# Patient Record
Sex: Female | Born: 1945 | Race: Black or African American | Hispanic: No | Smoking: Current some day smoker
Health system: Southern US, Community
[De-identification: ages and names within clinical notes are randomized; demographics above are authoritative.]

## PROBLEM LIST (undated history)

## (undated) DIAGNOSIS — I10 Essential (primary) hypertension: Secondary | ICD-10-CM

## (undated) DIAGNOSIS — K219 Gastro-esophageal reflux disease without esophagitis: Secondary | ICD-10-CM

## (undated) DIAGNOSIS — J449 Chronic obstructive pulmonary disease, unspecified: Secondary | ICD-10-CM

## (undated) DIAGNOSIS — M1990 Unspecified osteoarthritis, unspecified site: Secondary | ICD-10-CM

## (undated) DIAGNOSIS — M810 Age-related osteoporosis without current pathological fracture: Secondary | ICD-10-CM

## (undated) DIAGNOSIS — G4730 Sleep apnea, unspecified: Secondary | ICD-10-CM

## (undated) DIAGNOSIS — F329 Major depressive disorder, single episode, unspecified: Secondary | ICD-10-CM

## (undated) DIAGNOSIS — M51369 Other intervertebral disc degeneration, lumbar region without mention of lumbar back pain or lower extremity pain: Secondary | ICD-10-CM

## (undated) DIAGNOSIS — F1910 Other psychoactive substance abuse, uncomplicated: Secondary | ICD-10-CM

## (undated) DIAGNOSIS — F32A Depression, unspecified: Secondary | ICD-10-CM

## (undated) DIAGNOSIS — M159 Polyosteoarthritis, unspecified: Secondary | ICD-10-CM

## (undated) DIAGNOSIS — F419 Anxiety disorder, unspecified: Secondary | ICD-10-CM

## (undated) DIAGNOSIS — M5136 Other intervertebral disc degeneration, lumbar region: Secondary | ICD-10-CM

## (undated) HISTORY — DX: Anxiety disorder, unspecified: F41.9

## (undated) HISTORY — DX: Age-related osteoporosis without current pathological fracture: M81.0

## (undated) HISTORY — DX: Unspecified osteoarthritis, unspecified site: M19.90

## (undated) HISTORY — DX: Other intervertebral disc degeneration, lumbar region: M51.36

## (undated) HISTORY — DX: Gastro-esophageal reflux disease without esophagitis: K21.9

## (undated) HISTORY — DX: Essential (primary) hypertension: I10

## (undated) HISTORY — PX: CERVICAL SPINE SURGERY: SHX589

## (undated) HISTORY — PX: APPENDECTOMY: SHX54

## (undated) HISTORY — PX: CARPAL TUNNEL RELEASE: SHX101

## (undated) HISTORY — DX: Other psychoactive substance abuse, uncomplicated: F19.10

## (undated) HISTORY — DX: Polyosteoarthritis, unspecified: M15.9

## (undated) HISTORY — PX: ROTATOR CUFF REPAIR: SHX139

## (undated) HISTORY — DX: Sleep apnea, unspecified: G47.30

## (undated) HISTORY — DX: Major depressive disorder, single episode, unspecified: F32.9

## (undated) HISTORY — DX: Depression, unspecified: F32.A

## (undated) HISTORY — DX: Chronic obstructive pulmonary disease, unspecified: J44.9

## (undated) HISTORY — DX: Other intervertebral disc degeneration, lumbar region without mention of lumbar back pain or lower extremity pain: M51.369

---

## 2017-08-28 DIAGNOSIS — G44209 Tension-type headache, unspecified, not intractable: Secondary | ICD-10-CM | POA: Diagnosis not present

## 2017-08-28 DIAGNOSIS — Z6825 Body mass index (BMI) 25.0-25.9, adult: Secondary | ICD-10-CM | POA: Diagnosis not present

## 2017-08-28 DIAGNOSIS — J069 Acute upper respiratory infection, unspecified: Secondary | ICD-10-CM | POA: Diagnosis not present

## 2018-10-22 ENCOUNTER — Encounter: Payer: Self-pay | Admitting: Family Medicine

## 2018-10-22 ENCOUNTER — Ambulatory Visit (INDEPENDENT_AMBULATORY_CARE_PROVIDER_SITE_OTHER): Payer: Medicare Other | Admitting: Family Medicine

## 2018-10-22 VITALS — BP 144/82 | HR 77 | Temp 98.2°F | Resp 16 | Ht 67.5 in | Wt 163.2 lb

## 2018-10-22 DIAGNOSIS — Z8701 Personal history of pneumonia (recurrent): Secondary | ICD-10-CM

## 2018-10-22 DIAGNOSIS — Z9889 Other specified postprocedural states: Secondary | ICD-10-CM

## 2018-10-22 DIAGNOSIS — K219 Gastro-esophageal reflux disease without esophagitis: Secondary | ICD-10-CM

## 2018-10-22 DIAGNOSIS — Z23 Encounter for immunization: Secondary | ICD-10-CM | POA: Diagnosis not present

## 2018-10-22 DIAGNOSIS — G4733 Obstructive sleep apnea (adult) (pediatric): Secondary | ICD-10-CM

## 2018-10-22 DIAGNOSIS — I10 Essential (primary) hypertension: Secondary | ICD-10-CM

## 2018-10-22 DIAGNOSIS — J411 Mucopurulent chronic bronchitis: Secondary | ICD-10-CM | POA: Diagnosis not present

## 2018-10-22 DIAGNOSIS — G894 Chronic pain syndrome: Secondary | ICD-10-CM

## 2018-10-22 DIAGNOSIS — M51369 Other intervertebral disc degeneration, lumbar region without mention of lumbar back pain or lower extremity pain: Secondary | ICD-10-CM

## 2018-10-22 DIAGNOSIS — G4730 Sleep apnea, unspecified: Secondary | ICD-10-CM | POA: Insufficient documentation

## 2018-10-22 DIAGNOSIS — F5104 Psychophysiologic insomnia: Secondary | ICD-10-CM

## 2018-10-22 DIAGNOSIS — J449 Chronic obstructive pulmonary disease, unspecified: Secondary | ICD-10-CM | POA: Insufficient documentation

## 2018-10-22 DIAGNOSIS — M5136 Other intervertebral disc degeneration, lumbar region: Secondary | ICD-10-CM | POA: Insufficient documentation

## 2018-10-22 DIAGNOSIS — F339 Major depressive disorder, recurrent, unspecified: Secondary | ICD-10-CM

## 2018-10-22 DIAGNOSIS — M810 Age-related osteoporosis without current pathological fracture: Secondary | ICD-10-CM

## 2018-10-22 MED ORDER — PAROXETINE HCL 40 MG PO TABS: 40.0000 mg | ORAL_TABLET | ORAL | 1 refills | Status: DC

## 2018-10-22 MED ORDER — AMLODIPINE BESYLATE 5 MG PO TABS: 5.0000 mg | ORAL_TABLET | Freq: Every day | ORAL | 1 refills | Status: DC

## 2018-10-22 MED ORDER — LOSARTAN POTASSIUM 100 MG PO TABS: 100.0000 mg | ORAL_TABLET | Freq: Every day | ORAL | 1 refills | Status: DC

## 2018-10-22 MED ORDER — PANTOPRAZOLE SODIUM 40 MG PO TBEC: 40.0000 mg | DELAYED_RELEASE_TABLET | Freq: Every day | ORAL | 1 refills | Status: DC

## 2018-10-22 NOTE — Progress Notes (Signed)
Name: Natalie Lloyd   MRN: [id number]    DOB: 01/06/1946   Date:10/22/2018       Progress Note  Subjective  Chief Complaint  Chief Complaint  Patient presents with  . Establish Care  . Labs Only  . Immunizations    HPI   COPD : diagnosed years ago, not on maintenance medication and does not want it at this time. She states very sick this past Spring, she had pneumonia and was hospitalized . She states SOB and cough got worse after the pneumonia. No fever or chills at this time  HTN: she is taking medications, but states that ran out of losartan and has noticed a headache over the past couple of days. No chest pain or palpitation  Osteoporosis: she has not been taking medication, we will get records from [place]  Chronic pain: she had multiple surgeries, she has DDD spine and also right shoulder surgery and history of dislocation, she was seeing pain clinic at [place], she has been out of medication for the past 4 days , explained that we will place referral to local pain management clinic Pain on her shoulder is 7/10  GERD: she is on pantoprazole and she states no heartburn or regurgitation with medication, but unable to come off medication. She is aware of long term risk   Major Depression: she has a long history of depression, states she used to go weeks without living her house because of sadness. She started medication many years ago - about 15 years. Medication works well for her. No side effects.   Insomnia: she takes Trazodone prn only and is doing well at this time   Past Surgical History:  Procedure Laterality Date  . APPENDECTOMY    . CARPAL TUNNEL RELEASE    . CERVICAL SPINE SURGERY    . ROTATOR CUFF REPAIR      History reviewed. No pertinent family history.  Social History   Socioeconomic History  . Marital status: Widowed    Spouse name: Benne  . Number of children: 5  . Years of education: Not on file  . Highest education level: Bachelor's degree (e.g.,  BA, AB, BS)  Occupational History  . Occupation: Disabled  Social Needs  . Financial resource strain: Somewhat hard  . Food insecurity:    Worry: Never true    Inability: Never true  . Transportation needs:    Medical: No    Non-medical: No  Tobacco Use  . Smoking status: Current Some Day Smoker    Years: 50.00    Types: Cigarettes  . Smokeless tobacco: Never Used  . Tobacco comment: 3-4 cigarettes per day  Substance and Sexual Activity  . Alcohol use: Yes    Comment: occ wine cooler  . Drug use: Not Currently    Types: IV    Comment: 37.5 yrs ago  . Sexual activity: Not Currently    Partners: Male    Birth control/protection: None  Lifestyle  . Physical activity:    Days per week: 7 days    Minutes per session: 20 min  . Stress: Not at all  Relationships  . Social connections:    Talks on phone: More than three times a week    Gets together: Never    Attends religious service: 1 to 4 times per year    Active member of club or organization: No    Attends meetings of clubs or organizations: Never    Relationship status: Widowed  . Intimate partner  violence:    Fear of current or ex partner: No    Emotionally abused: No    Physically abused: No    Forced sexual activity: No  Other Topics Concern  . Not on file  Social History Narrative   Patient has 5 grown kids, 16 or 17 grandkids & about 3 great-grandkids.      Widowed for 4 yrs (10/6) now after 19yrs      Patient has moved here from [place].     Current Outpatient Medications:  .  albuterol (PROVENTIL HFA;VENTOLIN HFA) 108 (90 Base) MCG/ACT inhaler, Inhale 2 puffs into the lungs every 6 (six) hours as needed for wheezing or shortness of breath., Disp: , Rfl:  .  amLODipine (NORVASC) 5 MG tablet, Take 5 mg by mouth daily., Disp: , Rfl:  .  gabapentin (NEURONTIN) 100 MG capsule, Take 100 mg by mouth 3 (three) times daily., Disp: , Rfl:  .  HYDROcodone-acetaminophen (NORCO) 10-325 MG tablet, Take 1  tablet by mouth every 6 (six) hours as needed., Disp: , Rfl:  .  losartan (COZAAR) 100 MG tablet, Take 100 mg by mouth daily., Disp: , Rfl:  .  pantoprazole (PROTONIX) 40 MG tablet, Take 40 mg by mouth daily., Disp: , Rfl:  .  PARoxetine (PAXIL) 40 MG tablet, Take 40 mg by mouth every morning., Disp: , Rfl:  .  traZODone (DESYREL) 50 MG tablet, Take 50 mg by mouth at bedtime., Disp: , Rfl:   No Known Allergies  I personally reviewed active problem list, medication list, allergies, family history, social history with the patient/caregiver today.   ROS  Constitutional: Negative for fever or weight change.  Respiratory: positive  for cough and shortness of breath.   Cardiovascular: Negative for chest pain or palpitations.  Gastrointestinal: Negative for abdominal pain, no bowel changes.  Musculoskeletal: negative for gait problem ( difficulty going up stairs because cannot use arm to assist ) but no joint swelling.  Skin: Negative for rash.  Neurological: Negative for dizziness, positive  headache.  No other specific complaints in a complete review of systems (except as listed in HPI above).   Objective  Vitals:   10/22/18 1050  BP: (!) 144/82  Pulse: 77  Resp: 16  Temp: 98.2 F (36.8 C)  TempSrc: Oral  SpO2: 98%  Weight: 163 lb 3.2 oz (74 kg)  Height: 5' 7.5" (1.715 m)    Body mass index is 25.18 kg/m.  Physical Exam  Constitutional: Patient appears well-developed and well-nourished.  No distress.  HEENT: head atraumatic, normocephalic, pupils equal and reactive to light,  throat within normal limits Cardiovascular: Normal rate, regular rhythm and normal heart sounds.  No murmur heard. No BLE edema. Pulmonary/Chest: Effort normal and breath sounds normal. No respiratory distress. Abdominal: Soft.  There is no tenderness. Muscular skeletal: decrease rom of right shoulder, also decrease rom of neck  Psychiatric: Patient has a normal mood and affect. behavior is normal.  Judgment and thought content normal.  PHQ2/9: Depression screen PHQ 2/9 10/22/2018  Decreased Interest 0  Down, Depressed, Hopeless 0  PHQ - 2 Score 0  Altered sleeping 0  Tired, decreased energy 1  Change in appetite 1  Feeling bad or failure about yourself  0  Trouble concentrating 1  Moving slowly or fidgety/restless 0  Suicidal thoughts 0  PHQ-9 Score 3  Difficult doing work/chores Not difficult at all     Fall Risk: Fall Risk  10/22/2018  Falls in the past year?  1  Number falls in past yr: 1  Injury with Fall? 1  Risk for fall due to : History of fall(s)     Functional Status Survey: Is the patient deaf or have difficulty hearing?: No Does the patient have difficulty seeing, even when wearing glasses/contacts?: Yes(patient wears glasses for reading) Does the patient have difficulty concentrating, remembering, or making decisions?: Yes Does the patient have difficulty walking or climbing stairs?: Yes Does the patient have difficulty dressing or bathing?: No Does the patient have difficulty doing errands alone such as visiting a doctor's office or shopping?: Yes    Assessment & Plan  1. Mucopurulent chronic bronchitis (HCC)  - albuterol (PROVENTIL HFA;VENTOLIN HFA) 108 (90 Base) MCG/ACT inhaler; Inhale 2 puffs into the lungs every 6 (six) hours as needed for wheezing or shortness of breath.  2. Needs flu shot  Refuses  3. Chronic pain syndrome  - Ambulatory referral to Pain Clinic  4. History of shoulder surgery  - Ambulatory referral to Pain Clinic  5. Essential hypertension  - losartan (COZAAR) 100 MG tablet; Take 1 tablet (100 mg total) by mouth daily.  Dispense: 90 tablet; Refill: 1 - amLODipine (NORVASC) 5 MG tablet; Take 1 tablet (5 mg total) by mouth daily.  Dispense: 90 tablet; Refill: 1  6. Obstructive sleep apnea syndrome   7. Gastroesophageal reflux disease without esophagitis  - pantoprazole (PROTONIX) 40 MG tablet; Take 1 tablet (40 mg  total) by mouth daily.  Dispense: 90 tablet; Refill: 1  8. Age-related osteoporosis without current pathological fracture  Not on medication, we will get copy of bone density from [place]   9. Major depression, recurrent, chronic (HCC)  Long history of depression, on same medication for a long time and seems to be doing well  - PARoxetine (PAXIL) 40 MG tablet; Take 1 tablet (40 mg total) by mouth every morning.  Dispense: 90 tablet; Refill: 1  10. History of pneumonia   11. Chronic insomnia  - traZODone (DESYREL) 50 MG tablet; Take 50 mg by mouth at bedtime.  12. DDD (degenerative disc disease), lumbar

## 2018-11-25 ENCOUNTER — Other Ambulatory Visit: Payer: Self-pay

## 2018-11-25 ENCOUNTER — Ambulatory Visit
Payer: MEDICARE | Attending: Student in an Organized Health Care Education/Training Program | Admitting: Student in an Organized Health Care Education/Training Program

## 2018-11-25 ENCOUNTER — Encounter: Payer: Self-pay | Admitting: Student in an Organized Health Care Education/Training Program

## 2018-11-25 VITALS — BP 145/74 | HR 60 | Temp 98.3°F | Resp 16 | Ht 68.0 in | Wt 164.0 lb

## 2018-11-25 DIAGNOSIS — G894 Chronic pain syndrome: Secondary | ICD-10-CM | POA: Diagnosis not present

## 2018-11-25 DIAGNOSIS — Z79899 Other long term (current) drug therapy: Secondary | ICD-10-CM | POA: Diagnosis not present

## 2018-11-25 DIAGNOSIS — M810 Age-related osteoporosis without current pathological fracture: Secondary | ICD-10-CM | POA: Diagnosis not present

## 2018-11-25 DIAGNOSIS — I10 Essential (primary) hypertension: Secondary | ICD-10-CM | POA: Diagnosis not present

## 2018-11-25 DIAGNOSIS — Z96611 Presence of right artificial shoulder joint: Secondary | ICD-10-CM | POA: Diagnosis not present

## 2018-11-25 DIAGNOSIS — F17210 Nicotine dependence, cigarettes, uncomplicated: Secondary | ICD-10-CM | POA: Insufficient documentation

## 2018-11-25 DIAGNOSIS — K219 Gastro-esophageal reflux disease without esophagitis: Secondary | ICD-10-CM | POA: Insufficient documentation

## 2018-11-25 DIAGNOSIS — J449 Chronic obstructive pulmonary disease, unspecified: Secondary | ICD-10-CM | POA: Diagnosis not present

## 2018-11-25 DIAGNOSIS — M542 Cervicalgia: Secondary | ICD-10-CM | POA: Diagnosis not present

## 2018-11-25 DIAGNOSIS — F329 Major depressive disorder, single episode, unspecified: Secondary | ICD-10-CM | POA: Insufficient documentation

## 2018-11-25 DIAGNOSIS — F419 Anxiety disorder, unspecified: Secondary | ICD-10-CM | POA: Insufficient documentation

## 2018-11-25 DIAGNOSIS — M549 Dorsalgia, unspecified: Secondary | ICD-10-CM | POA: Insufficient documentation

## 2018-11-25 DIAGNOSIS — M5136 Other intervertebral disc degeneration, lumbar region: Secondary | ICD-10-CM | POA: Diagnosis not present

## 2018-11-25 DIAGNOSIS — M25559 Pain in unspecified hip: Secondary | ICD-10-CM | POA: Insufficient documentation

## 2018-11-25 DIAGNOSIS — M129 Arthropathy, unspecified: Secondary | ICD-10-CM | POA: Diagnosis not present

## 2018-11-25 DIAGNOSIS — Z9889 Other specified postprocedural states: Secondary | ICD-10-CM

## 2018-11-25 DIAGNOSIS — M25511 Pain in right shoulder: Secondary | ICD-10-CM | POA: Insufficient documentation

## 2018-11-25 DIAGNOSIS — M19011 Primary osteoarthritis, right shoulder: Secondary | ICD-10-CM

## 2018-11-25 DIAGNOSIS — G4730 Sleep apnea, unspecified: Secondary | ICD-10-CM | POA: Insufficient documentation

## 2018-11-25 DIAGNOSIS — M67921 Unspecified disorder of synovium and tendon, right upper arm: Secondary | ICD-10-CM

## 2018-11-25 NOTE — Progress Notes (Signed)
Safety precautions to be maintained throughout the outpatient stay will include: orient to surroundings, keep bed in low position, maintain call bell within reach at all times, provide assistance with transfer out of bed and ambulation.  

## 2018-11-25 NOTE — Progress Notes (Signed)
Patient's Name: Natalie Lloyd  MRN: [id number]  Referring Provider: Steele Sizer, MD  DOB: 1946/11/18  PCP: Steele Sizer, MD  DOS: 11/25/2018  Note by: Gillis Santa, MD  Service setting: Ambulatory outpatient  Specialty: Interventional Pain Management  Location: [facility] (AMB) Pain Management Facility  Visit type: Initial Patient Evaluation  Patient type: New Patient   Primary Reason(s) for Visit: Encounter for initial evaluation of one or more chronic problems (new to examiner) potentially causing chronic pain, and posing a threat to normal musculoskeletal function. (Level of risk: High) CC: Shoulder Pain (right); Neck Pain; Back Pain; and Hip Pain  HPI  Natalie Lloyd is a 72 y.o. year old, female patient, who comes today to see [place] for the first time for an initial evaluation of her chronic pain. She has COPD (chronic obstructive pulmonary disease) ([place]); Hypertension; Sleep apnea; GERD (gastroesophageal reflux disease); Osteoporosis; History of pneumonia; Major depression, recurrent, chronic ([place]); Chronic insomnia; and DDD (degenerative disc disease), lumbar on their problem list. Today she comes in for evaluation of her Shoulder Pain (right); Neck Pain; Back Pain; and Hip Pain  Pain Assessment: Location: Right Shoulder Radiating: denies Onset: More than a month ago Duration: Chronic pain Quality: Stabbing, Shooting, Aching Severity: 8 /10 (subjective, self-reported pain score)  Note: Reported level is inconsistent with clinical observations. Clinically the patient looks like a 3/10 A 3/10 is viewed as "Moderate" and described as significantly interfering with activities of daily living (ADL). It becomes difficult to feed, bathe, get dressed, get on and off the toilet or to perform personal hygiene functions. Difficult to get in and out of bed or a chair without assistance. Very distracting. With effort, it can be ignored when deeply involved in activities.       When using our objective Pain  Scale, levels between 6 and 10/10 are said to belong in an emergency room, as it progressively worsens from a 6/10, described as severely limiting, requiring emergency care not usually available at an outpatient pain management facility. At a 6/10 level, communication becomes difficult and requires great effort. Assistance to reach the emergency department may be required. Facial flushing and profuse sweating along with potentially dangerous increases in heart rate and blood pressure will be evident. Effect on ADL: "I cant hold anything over 5 lbs" Timing: Intermittent Modifying factors: medication BP: (!) 145/74  HR: 60  Onset and Duration: Date of onset: 05/2013 and Present longer than 3 months Cause of pain: Unknown Severity: Getting worse, No change since onset, NAS-11 at its worse: 10/10, NAS-11 at its best: 8/10, NAS-11 now: 8/10 and NAS-11 on the average: 10/10 Timing: Morning, Afternoon and Night Aggravating Factors: Lifiting and Surgery made it worse Alleviating Factors: Lying down, Medications and Warm showers or baths Associated Problems: Fatigue, Sadness, Spasms, Pain that wakes patient up and Pain that does not allow patient to sleep Quality of Pain: Constant, Sharp, Shooting, Throbbing and Uncomfortable Previous Examinations or Tests: Nerve block, X-rays and Orthopedic evaluation Previous Treatments: Epidural steroid injections, Narcotic medications and Trigger point injections  The patient comes into the clinics today for the first time for a chronic pain management evaluation.   Patient is a 72 year old female presents to the clinic with a chief complaint of right shoulder pain, right neck pain.  Patient has a history of over 5 shoulder surgeries with a complete replacement.  This was done in [place].  She was managed by pain management there at her orthopedist clinic.  She was managed on hydrocodone  10 mg 3 times daily as needed, quantity 90/month.  She states that this  medication was helpful for her shoulder pain and helped improve her functional status.  Patient would also obtain right shoulder injections every 3 to 6 months for her pain.  Patient also has a history of cervical spine surgery.  She also has persistent right biceps tendinopathy and associated pain.  Today I took the time to provide the patient with information regarding my pain practice. The patient was informed that my practice is divided into two sections: an interventional pain management section, as well as a completely separate and distinct medication management section. I explained that I have procedure days for my interventional therapies, and evaluation days for follow-ups and medication management. Because of the amount of documentation required during both, they are kept separated. This means that there is the possibility that she may be scheduled for a procedure on one day, and medication management the next. I have also informed her that because of staffing and facility limitations, I no longer take patients for medication management only. To illustrate the reasons for this, I gave the patient the example of surgeons, and how inappropriate it would be to refer a patient to his/her care, just to write for the post-surgical antibiotics on a surgery done by a different surgeon.   Because interventional pain management is my board-certified specialty, the patient was informed that joining my practice means that they are open to any and all interventional therapies. I made it clear that this does not mean that they will be forced to have any procedures done. What this means is that I believe interventional therapies to be essential part of the diagnosis and proper management of chronic pain conditions. Therefore, patients not interested in these interventional alternatives will be better served under the care of a different practitioner.  The patient was also made aware of my Comprehensive Pain  Management Safety Guidelines where by joining my practice, they limit all of their nerve blocks and joint injections to those done by our practice, for as long as we are retained to manage their care.   Historic Controlled Substance Pharmacotherapy Review  PMP and historical list of controlled substances: Hydrocodone 10 mg 3 times daily as needed, quantity 90/month MME/day: 30 mg/day Medications: The patient did not bring the medication(s) to the appointment, as requested in our "New Patient Package" Pharmacodynamics: Desired effects: Analgesia: The patient reports >50% benefit. Reported improvement in function: The patient reports medication allows her to accomplish basic ADLs. Clinically meaningful improvement in function (CMIF): Sustained CMIF goals met Perceived effectiveness: Described as relatively effective, allowing for increase in activities of daily living (ADL) Undesirable effects: Side-effects or Adverse reactions: None reported Historical Monitoring: The patient  reports that she has current or past drug history. Drug: IV. List of all UDS Test(s): No results found for: MDMA, COCAINSCRNUR, [place], [place], CANNABQUANT, [place], [place] List of other Serum/Urine Drug Screening Test(s):  No results found for: AMPHSCRSER, BARBSCRSER, BENZOSCRSER, COCAINSCRSER, COCAINSCRNUR, PCPSCRSER, PCPQUANT, THCSCRSER, THCU, CANNABQUANT, OPIATESCRSER, OXYSCRSER, PROPOXSCRSER, ETH Historical Background Evaluation: [place] PMP: Six (6) year initial data search conducted.             [place] Department of public safety, offender search: Editor, commissioning Information) Non-contributory Risk Assessment Profile: Aberrant behavior: None observed or detected today Risk factors for fatal opioid overdose: None identified today Fatal overdose hazard ratio (HR): Calculation deferred Non-fatal overdose hazard ratio (HR): Calculation deferred Risk of opioid abuse or dependence: 0.7-3.0% with doses ?  36 MME/day and 6.1-26% with  doses ? 120 MME/day. Substance use disorder (SUD) risk level: See below Personal History of Substance Abuse (SUD-Substance use disorder):  Alcohol: Negative  Illegal Drugs: Positive Female or Female  Rx Drugs: Negative  ORT Risk Level calculation: Moderate Risk Opioid Risk Tool - 11/25/18 0916      Family History of Substance Abuse   Alcohol  Negative    Illegal Drugs  Positive Female    Rx Drugs  Negative      Personal History of Substance Abuse   Alcohol  Negative    Illegal Drugs  Positive Female or Female    Rx Drugs  Negative      Age   Age between 49-45 years   No      History of Preadolescent Sexual Abuse   History of Preadolescent Sexual Abuse  Negative or Female      Psychological Disease   Psychological Disease  Negative    Depression  Positive      Total Score   Opioid Risk Tool Scoring  7    Opioid Risk Interpretation  Moderate Risk      ORT Scoring interpretation table:  Score <3 = Low Risk for SUD  Score between 4-7 = Moderate Risk for SUD  Score >8 = High Risk for Opioid Abuse   PHQ-2 Depression Scale:  Total score: 0  PHQ-2 Scoring interpretation table: (Score and probability of major depressive disorder)  Score 0 = No depression  Score 1 = 15.4% Probability  Score 2 = 21.1% Probability  Score 3 = 38.4% Probability  Score 4 = 45.5% Probability  Score 5 = 56.4% Probability  Score 6 = 78.6% Probability   PHQ-9 Depression Scale:  Total score: 0  PHQ-9 Scoring interpretation table:  Score 0-4 = No depression  Score 5-9 = Mild depression  Score 10-14 = Moderate depression  Score 15-19 = Moderately severe depression  Score 20-27 = Severe depression (2.4 times higher risk of SUD and 2.89 times higher risk of overuse)   Pharmacologic Plan: As per protocol, I have not taken over any controlled substance management, pending the results of ordered tests and/or consults.            Initial impression: Pending review of available data and ordered  tests.  Meds   Current Outpatient Medications:  .  albuterol (PROVENTIL HFA;VENTOLIN HFA) 108 (90 Base) MCG/ACT inhaler, Inhale 2 puffs into the lungs every 6 (six) hours as needed for wheezing or shortness of breath., Disp: , Rfl:  .  amLODipine (NORVASC) 5 MG tablet, Take 1 tablet (5 mg total) by mouth daily., Disp: 90 tablet, Rfl: 1 .  gabapentin (NEURONTIN) 100 MG capsule, Take 100 mg by mouth 3 (three) times daily., Disp: , Rfl:  .  HYDROcodone-acetaminophen (NORCO) 10-325 MG tablet, Take 1 tablet by mouth every 6 (six) hours as needed., Disp: , Rfl:  .  losartan (COZAAR) 100 MG tablet, Take 1 tablet (100 mg total) by mouth daily., Disp: 90 tablet, Rfl: 1 .  pantoprazole (PROTONIX) 40 MG tablet, Take 1 tablet (40 mg total) by mouth daily., Disp: 90 tablet, Rfl: 1 .  PARoxetine (PAXIL) 40 MG tablet, Take 1 tablet (40 mg total) by mouth every morning., Disp: 90 tablet, Rfl: 1 .  traZODone (DESYREL) 50 MG tablet, Take 50 mg by mouth at bedtime., Disp: , Rfl:     ROS  Cardiovascular: High blood pressure Pulmonary or Respiratory: Smoking Neurological: No reported neurological signs  or symptoms such as seizures, abnormal skin sensations, urinary and/or fecal incontinence, being born with an abnormal open spine and/or a tethered spinal cord Review of Past Neurological Studies: No results found for this or any previous visit. Psychological-Psychiatric: Anxiousness Gastrointestinal: Reflux or heatburn Genitourinary: No reported renal or genitourinary signs or symptoms such as difficulty voiding or producing urine, peeing blood, non-functioning kidney, kidney stones, difficulty emptying the bladder, difficulty controlling the flow of urine, or chronic kidney disease Hematological: No reported hematological signs or symptoms such as prolonged bleeding, low or poor functioning platelets, bruising or bleeding easily, hereditary bleeding problems, low energy levels due to low hemoglobin or being  anemic Endocrine: No reported endocrine signs or symptoms such as high or low blood sugar, rapid heart rate due to high thyroid levels, obesity or weight gain due to slow thyroid or thyroid disease Rheumatologic: Joint aches and or swelling due to excess weight (Osteoarthritis) Musculoskeletal: Negative for myasthenia gravis, muscular dystrophy, multiple sclerosis or malignant hyperthermia Work History: Out on work excuse  Allergies  Ms. Lueth has No Known Allergies.  Laboratory Chemistry  Inflammation Markers (CRP: Acute Phase) (ESR: Chronic Phase) No results found for: CRP, ESRSEDRATE, LATICACIDVEN                       Rheumatology Markers No results found for: RF, ANA, LABURIC, URICUR, LYMEIGGIGMAB, LYMEABIGMQN, HLAB27                      Renal Function Markers No results found for: BUN, CREATININE, BCR, GFRAA, GFRNONAA, LABVMA, EPIRU, [id number], NOREPRU, NOREPI24HUR, DOPARU, [id number]                           Hepatic Function Markers No results found for: AST, ALT, ALBUMIN, ALKPHOS, HCVAB, AMYLASE, LIPASE, AMMONIA                      Electrolytes No results found for: NA, K, CL, CALCIUM, MG, PHOS                      Neuropathy Markers No results found for: VITAMINB12, FOLATE, HGBA1C, HIV                      CNS Tests No results found for: COLORCSF, APPEARCSF, RBCCOUNTCSF, WBCCSF, POLYSCSF, LYMPHSCSF, EOSCSF, PROTEINCSF, GLUCCSF, JCVIRUS, CSFOLI, IGGCSF                      Bone Pathology Markers No results found for: VD25OH, [id number], [id number], [id number], 25OHVITD1, 25OHVITD2, 25OHVITD3, TESTOFREE, TESTOSTERONE                       Coagulation Parameters No results found for: INR, LABPROT, APTT, PLT, DDIMER, LABHEMA, VITAMINK1                      Cardiovascular Markers No results found for: BNP, CKTOTAL, CKMB, TROPONINI, HGB, HCT                       CA Markers No results found for: CEA, CA125, LABCA2                      Note: Lab results  reviewed.  PFSH  Drug: Ms. Tellez  reports that she has current or past drug history.  Drug: IV. Alcohol:  reports that she drinks alcohol. Tobacco:  reports that she has been smoking cigarettes. She has smoked for the past 50.00 years. She has never used smokeless tobacco. Medical:  has a past medical history of Anxiety, Arthritis, COPD (chronic obstructive pulmonary disease) ([place]), DDD (degenerative disc disease), lumbar, Depression, Generalized OA, GERD (gastroesophageal reflux disease), Hypertension, Osteoporosis, Sleep apnea, and Substance abuse ([place]). Family: family history is not on file.  Past Surgical History:  Procedure Laterality Date  . APPENDECTOMY    . CARPAL TUNNEL RELEASE    . CERVICAL SPINE SURGERY    . ROTATOR CUFF REPAIR     Active Ambulatory Problems    Diagnosis Date Noted  . COPD (chronic obstructive pulmonary disease) ([place]) 10/22/2018  . Hypertension 10/22/2018  . Sleep apnea 10/22/2018  . GERD (gastroesophageal reflux disease) 10/22/2018  . Osteoporosis 10/22/2018  . History of pneumonia 10/22/2018  . Major depression, recurrent, chronic ([place]) 10/22/2018  . Chronic insomnia 10/22/2018  . DDD (degenerative disc disease), lumbar 10/22/2018   Resolved Ambulatory Problems    Diagnosis Date Noted  . No Resolved Ambulatory Problems   Past Medical History:  Diagnosis Date  . Anxiety   . Arthritis   . Depression   . Generalized OA   . Substance abuse ([place])    Constitutional Exam  General appearance: Well nourished, well developed, and well hydrated. In no apparent acute distress Vitals:   11/25/18 0907  BP: (!) 145/74  Pulse: 60  Resp: 16  Temp: 98.3 F (36.8 C)  SpO2: 100%  Weight: 164 lb (74.4 kg)  Height: _0  (1.727 m)   BMI Assessment: Estimated body mass index is 24.94 kg/m as calculated from the following:   Height as of this encounter: _1  (1.727 m).   Weight as of this encounter: 164 lb (74.4 kg).  BMI interpretation table: BMI  level Category Range association with higher incidence of chronic pain  <18 kg/m2 Underweight   18.5-24.9 kg/m2 Ideal body weight   25-29.9 kg/m2 Overweight Increased incidence by 20%  30-34.9 kg/m2 Obese (Class I) Increased incidence by 68%  35-39.9 kg/m2 Severe obesity (Class II) Increased incidence by 136%  >40 kg/m2 Extreme obesity (Class III) Increased incidence by 254%   Patient's current BMI Ideal Body weight  Body mass index is 24.94 kg/m. Ideal body weight: 63.9 kg (140 lb 14 oz) Adjusted ideal body weight: 68.1 kg (150 lb 2 oz)   BMI Readings from Last 4 Encounters:  11/25/18 24.94 kg/m  10/22/18 25.18 kg/m   Wt Readings from Last 4 Encounters:  11/25/18 164 lb (74.4 kg)  10/22/18 163 lb 3.2 oz (74 kg)  Psych/Mental status: Alert, oriented x 3 (person, place, & time)       Eyes: PERLA Respiratory: No evidence of acute respiratory distress  Cervical Spine Area Exam  Skin & Axial Inspection: No masses, redness, edema, swelling, or associated skin lesions Alignment: Symmetrical Functional ROM: Decreased ROM      Stability: No instability detected Muscle Tone/Strength: Functionally intact. No obvious neuro-muscular anomalies detected. Sensory (Neurological): Arthropathic arthralgia Palpation: Complains of area being tender to palpation Positive provocative maneuver for for cervical facet disease  Upper Extremity (UE) Exam    Side: Right upper extremity  Side: Left upper extremity  Skin & Extremity Inspection: Atrophy  Skin & Extremity Inspection: Skin color, temperature, and hair growth are WNL. No peripheral edema or cyanosis. No masses, redness, swelling, asymmetry, or associated skin lesions. No contractures.  Functional  ROM: Decreased ROM for shoulder and elbow  Functional ROM: Unrestricted ROM          Muscle Tone/Strength: Functionally intact. No obvious neuro-muscular anomalies detected.  Muscle Tone/Strength: Functionally intact. No obvious neuro-muscular  anomalies detected.  Sensory (Neurological): Musculoskeletal pain pattern          Sensory (Neurological): Unimpaired          Palpation: Complains of area being tender to palpation              Palpation: No palpable anomalies              Provocative Test(s):  Phalen's test: deferred Tinel's test: deferred Apley's scratch test (touch opposite shoulder):  Action 1 (Across chest): Decreased ROM Action 2 (Overhead): Decreased ROM Action 3 (LB reach): Decreased ROM   Provocative Test(s):  Phalen's test: deferred Tinel's test: deferred Apley's scratch test (touch opposite shoulder):  Action 1 (Across chest): deferred Action 2 (Overhead): deferred Action 3 (LB reach): deferred    Thoracic Spine Area Exam  Skin & Axial Inspection: No masses, redness, or swelling Alignment: Symmetrical Functional ROM: Unrestricted ROM Stability: No instability detected Muscle Tone/Strength: Functionally intact. No obvious neuro-muscular anomalies detected. Sensory (Neurological): Unimpaired Muscle strength & Tone: No palpable anomalies  Lumbar Spine Area Exam  Skin & Axial Inspection: No masses, redness, or swelling Alignment: Symmetrical Functional ROM: Unrestricted ROM       Stability: No instability detected Muscle Tone/Strength: Functionally intact. No obvious neuro-muscular anomalies detected. Sensory (Neurological): Unimpaired Palpation: No palpable anomalies       Provocative Tests: Hyperextension/rotation test: deferred today       Lumbar quadrant test (Kemp's test): deferred today       Lateral bending test: deferred today       Patrick's Maneuver: deferred today                   FABER* test: deferred today                   S-I anterior distraction/compression test: deferred today         S-I lateral compression test: deferred today         S-I Thigh-thrust test: deferred today         S-I Gaenslen's test: deferred today         *(Flexion, ABduction and External Rotation)  Gait &  Posture Assessment  Ambulation: Unassisted Gait: Relatively normal for age and body habitus Posture: WNL   Lower Extremity Exam    Side: Right lower extremity  Side: Left lower extremity  Stability: No instability observed          Stability: No instability observed          Skin & Extremity Inspection: Skin color, temperature, and hair growth are WNL. No peripheral edema or cyanosis. No masses, redness, swelling, asymmetry, or associated skin lesions. No contractures.  Skin & Extremity Inspection: Skin color, temperature, and hair growth are WNL. No peripheral edema or cyanosis. No masses, redness, swelling, asymmetry, or associated skin lesions. No contractures.  Functional ROM: Unrestricted ROM                  Functional ROM: Unrestricted ROM                  Muscle Tone/Strength: Functionally intact. No obvious neuro-muscular anomalies detected.  Muscle Tone/Strength: Functionally intact. No obvious neuro-muscular anomalies detected.  Sensory (Neurological): Unimpaired  Sensory (Neurological): Unimpaired        DTR: Patellar: deferred today Achilles: deferred today Plantar: deferred today  DTR: Patellar: deferred today Achilles: deferred today Plantar: deferred today  Palpation: No palpable anomalies  Palpation: No palpable anomalies   Assessment  Primary Diagnosis & Pertinent Problem List: The primary encounter diagnosis was H/O total shoulder replacement, right. Diagnoses of H/O cervical spine surgery, Arthropathy of right shoulder, Pain in joint of right shoulder, Disorder of tendon of right biceps, and Chronic pain syndrome were also pertinent to this visit.  Visit Diagnosis (New problems to examiner): 1. H/O total shoulder replacement, right   2. H/O cervical spine surgery   3. Arthropathy of right shoulder   4. Pain in joint of right shoulder   5. Disorder of tendon of right biceps   6. Chronic pain syndrome    Patient is a 71 year old female presents to the clinic  with a chief complaint of right shoulder pain, right neck pain.  Patient has a history of over 5 shoulder surgeries with a complete replacement.  This was done in [place].  She was managed by pain management there at her orthopedist clinic.  She was managed on hydrocodone 10 mg 3 times daily as needed, quantity 90/month.  She states that this medication was helpful for her shoulder pain and helped improve her functional status.  Patient would also obtain right shoulder injections every 3 to 6 months for her pain.  Patient also has a history of cervical spine surgery.  She also has persistent right biceps tendinopathy and associated pain.  In regards to treatment plan, patient is on low-dose opioid therapy and has been on this for many years per history.  I informed the patient that we will need records from her orthopedic clinic in [facility] where her pain was managed.  We will also obtain UDS today.  This should be negative for any opioid medications as the patient has been out for more than 2 months.  She notes worsening quality of life, reduced functional status while she has been off these medications.  So long as UDS appropriate and we have outside medical records from pain clinic in [facility], reasonable to take over patient's chronic opioid regimen of hydrocodone 10 mg 3 times daily as needed, MME equals 30.  Patient can also be a candidate for right posterior shoulder joint injection, right cervical facet blocks, trigger point injections.  Patient to follow-up with nurse practitioner, Dionisio David.  Plan of Care (Initial workup plan)  Note: Please be advised that as per protocol, today's visit has been an evaluation only. We have not taken over the patient's controlled substance management.  Ordered Lab-work, Procedure(s), Referral(s), & Consult(s): Orders Placed This Encounter  Procedures  . Compliance Drug Analysis, Ur   Pharmacological management options:  Opioid  Analgesics: The patient was informed that there is no guarantee that she would be a candidate for opioid analgesics. The decision will be made following CDC guidelines. This decision will be based on the results of diagnostic studies, as well as Ms. Pulis's risk profile.   Membrane stabilizer: Continue gabapentin 100 mg 3 times daily  Muscle relaxant: To be determined at a later time  NSAID: To be determined at a later time  Other analgesic(s): To be determined at a later time   Interventional management options: Ms. Shugrue was informed that there is no guarantee that she would be a candidate for interventional therapies. The decision will be based on  the results of diagnostic studies, as well as Ms. Doughman's risk profile.  Procedure(s) under consideration:  Right posterior shoulder joint injection Right cervical facet medial branch nerve blocks at C4, C5, C6, C7 Right trigger point injections, cervical/trapezius   Provider-requested follow-up: Return in about 8 days (around 12/03/2018) for Medication Management.  Future Appointments  Date Time Provider Rankin  12/02/2018  8:45 AM Vevelyn Francois, NP [facility] None  01/23/2019 10:20 AM Steele Sizer, MD [facility]  07/24/2019 10:40 AM [place] ADVISOR [place]    Primary Care Physician: Steele Sizer, MD Location: [facility] Note by: Gillis Santa, M.D, Date: 11/25/2018; Time: 2:25 PM  There are no Patient Instructions on file for this visit.

## 2018-11-29 LAB — COMPLIANCE DRUG ANALYSIS, UR

## 2018-12-02 ENCOUNTER — Encounter: Payer: Self-pay | Admitting: Nurse Practitioner

## 2018-12-02 ENCOUNTER — Ambulatory Visit: Payer: MEDICARE | Attending: Nurse Practitioner | Admitting: Nurse Practitioner

## 2018-12-02 ENCOUNTER — Other Ambulatory Visit: Payer: Self-pay

## 2018-12-02 VITALS — BP 119/67 | HR 67 | Temp 98.0°F | Resp 18 | Ht 68.0 in | Wt 163.0 lb

## 2018-12-02 DIAGNOSIS — G4730 Sleep apnea, unspecified: Secondary | ICD-10-CM | POA: Insufficient documentation

## 2018-12-02 DIAGNOSIS — M67921 Unspecified disorder of synovium and tendon, right upper arm: Secondary | ICD-10-CM

## 2018-12-02 DIAGNOSIS — J449 Chronic obstructive pulmonary disease, unspecified: Secondary | ICD-10-CM | POA: Insufficient documentation

## 2018-12-02 DIAGNOSIS — M47812 Spondylosis without myelopathy or radiculopathy, cervical region: Secondary | ICD-10-CM

## 2018-12-02 DIAGNOSIS — M5136 Other intervertebral disc degeneration, lumbar region: Secondary | ICD-10-CM | POA: Insufficient documentation

## 2018-12-02 DIAGNOSIS — M25511 Pain in right shoulder: Secondary | ICD-10-CM

## 2018-12-02 DIAGNOSIS — M67911 Unspecified disorder of synovium and tendon, right shoulder: Secondary | ICD-10-CM | POA: Insufficient documentation

## 2018-12-02 DIAGNOSIS — F17210 Nicotine dependence, cigarettes, uncomplicated: Secondary | ICD-10-CM | POA: Diagnosis not present

## 2018-12-02 DIAGNOSIS — Z79899 Other long term (current) drug therapy: Secondary | ICD-10-CM | POA: Diagnosis not present

## 2018-12-02 DIAGNOSIS — F419 Anxiety disorder, unspecified: Secondary | ICD-10-CM | POA: Diagnosis not present

## 2018-12-02 DIAGNOSIS — K219 Gastro-esophageal reflux disease without esophagitis: Secondary | ICD-10-CM | POA: Diagnosis not present

## 2018-12-02 DIAGNOSIS — Z5181 Encounter for therapeutic drug level monitoring: Secondary | ICD-10-CM | POA: Diagnosis present

## 2018-12-02 DIAGNOSIS — G894 Chronic pain syndrome: Secondary | ICD-10-CM

## 2018-12-02 DIAGNOSIS — F329 Major depressive disorder, single episode, unspecified: Secondary | ICD-10-CM | POA: Diagnosis not present

## 2018-12-02 DIAGNOSIS — I10 Essential (primary) hypertension: Secondary | ICD-10-CM | POA: Insufficient documentation

## 2018-12-02 DIAGNOSIS — Z9889 Other specified postprocedural states: Secondary | ICD-10-CM | POA: Insufficient documentation

## 2018-12-02 DIAGNOSIS — M19011 Primary osteoarthritis, right shoulder: Secondary | ICD-10-CM

## 2018-12-02 DIAGNOSIS — G8929 Other chronic pain: Secondary | ICD-10-CM | POA: Insufficient documentation

## 2018-12-02 MED ORDER — HYDROCODONE-ACETAMINOPHEN 10-325 MG PO TABS: 1.0000 | ORAL_TABLET | Freq: Three times a day (TID) | ORAL | 0 refills | Status: DC | PRN

## 2018-12-02 NOTE — Progress Notes (Signed)
Safety precautions to be maintained throughout the outpatient stay will include: orient to surroundings, keep bed in low position, maintain call bell within reach at all times, provide assistance with transfer out of bed and ambulation.  

## 2018-12-02 NOTE — Patient Instructions (Signed)
____________________________________________________________________________________________  Appointment Policy Summary  It is our goal and responsibility to provide the medical community with assistance in the evaluation and management of patients with chronic pain. Unfortunately our resources are limited. Because we do not have an unlimited amount of time, or available appointments, we are required to closely monitor and manage their use. The following rules exist to maximize their use:  Patient's responsibilities: 1. Punctuality:  At what time should I arrive? You should be physically present in our office 30 minutes before your scheduled appointment. Your scheduled appointment is with your assigned healthcare provider. However, it takes 5-10 minutes to be "checked-in", and another 15 minutes for the nurses to do the admission. If you arrive to our office at the time you were given for your appointment, you will end up being at least 20-25 minutes late to your appointment with the provider. 2. Tardiness:  What happens if I arrive only a few minutes after my scheduled appointment time? You will need to reschedule your appointment. The cutoff is your appointment time. This is why it is so important that you arrive at least 30 minutes before that appointment. If you have an appointment scheduled for 10:00 AM and you arrive at 10:01, you will be required to reschedule your appointment.  3. Plan ahead:  Always assume that you will encounter traffic on your way in. Plan for it. If you are dependent on a driver, make sure they understand these rules and the need to arrive early. 4. Other appointments and responsibilities:  Avoid scheduling any other appointments before or after your pain clinic appointments.  5. Be prepared:  Write down everything that you need to discuss with your healthcare provider and give this information to the admitting nurse. Write down the medications that you will need  refilled. Bring your pills and bottles (even the empty ones), to all of your appointments, except for those where a procedure is scheduled. 6. No children or pets:  Find someone to take care of them. It is not appropriate to bring them in. 7. Scheduling changes:  We request "advanced notification" of any changes or cancellations. 8. Advanced notification:  Defined as a time period of more than 24 hours prior to the originally scheduled appointment. This allows for the appointment to be offered to other patients. 9. Rescheduling:  When a visit is rescheduled, it will require the cancellation of the original appointment. For this reason they both fall within the category of "Cancellations".  10. Cancellations:  They require advanced notification. Any cancellation less than 24 hours before the  appointment will be recorded as a "No Show". 11. No Show:  Defined as an unkept appointment where the patient failed to notify or declare to the practice their intention or inability to keep the appointment.  Corrective process for repeat offenders:  1. Tardiness: Three (3) episodes of rescheduling due to late arrivals will be recorded as one (1) "No Show". 2. Cancellation or reschedule: Three (3) cancellations or rescheduling will be recorded as one (1) "No Show". 3. "No Shows": Three (3) "No Shows" within a 12 month period will result in discharge from the practice. ____________________________________________________________________________________________  ____________________________________________________________________________________________  Medication Rules  Purpose: To inform patients, and their family members, of our rules and regulations.  Applies to: All patients receiving prescriptions (written or electronic).  Pharmacy of record: Pharmacy where electronic prescriptions will be sent. If written prescriptions are taken to a different pharmacy, please inform the nursing staff. The  pharmacy listed in  the electronic medical record should be the one where you would like electronic prescriptions to be sent.  Electronic prescriptions: In compliance with the [facility] Strengthen Opioid Misuse Prevention (STOP) Act of 2017 (Session [place] ElliotLaw [phone]), effective December 17, 2018, all controlled substances must be electronically prescribed. Calling prescriptions to the pharmacy will cease to exist.  Prescription refills: Only during scheduled appointments. Applies to all prescriptions.  NOTE: The following applies primarily to controlled substances (Opioid* Pain Medications).   Patient's responsibilities: 1. Pain Pills: Bring all pain pills to every appointment (except for procedure appointments). 2. Pill Bottles: Bring pills in original pharmacy bottle. Always bring the newest bottle. Bring bottle, even if empty. 3. Medication refills: You are responsible for knowing and keeping track of what medications you take and those you need refilled. The day before your appointment: write a list of all prescriptions that need to be refilled. The day of the appointment: give the list to the admitting nurse. Prescriptions will be written only during appointments. If you forget a medication: it will not be "Called in", "Faxed", or "electronically sent". You will need to get another appointment to get these prescribed. No early refills. Do not call asking to have your prescription filled early. 4. Prescription Accuracy: You are responsible for carefully inspecting your prescriptions before leaving our office. Have the discharge nurse carefully go over each prescription with you, before taking them home. Make sure that your name is accurately spelled, that your address is correct. Check the name and dose of your medication to make sure it is accurate. Check the number of pills, and the written instructions to make sure they are clear and accurate. Make sure that you are given enough medication  to last until your next medication refill appointment. 5. Taking Medication: Take medication as prescribed. When it comes to controlled substances, taking less pills or less frequently than prescribed is permitted and encouraged. Never take more pills than instructed. Never take medication more frequently than prescribed.  6. Inform other Doctors: Always inform, all of your healthcare providers, of all the medications you take. 7. Pain Medication from other Providers: You are not allowed to accept any additional pain medication from any other Doctor or Healthcare provider. There are two exceptions to this rule. (see below) In the event that you require additional pain medication, you are responsible for notifying us, as stated below. 8. Medication Agreement: You are responsible for carefully reading and following our Medication Agreement. This must be signed before receiving any prescriptions from our practice. Safely store a copy of your signed Agreement. Violations to the Agreement will result in no further prescriptions. (Additional copies of our Medication Agreement are available upon request.) 9. Laws, Rules, & Regulations: All patients are expected to follow all [address] and [place] Disney[place] Laws, [employer]utes, Rules, [place]& Regulations. Ignorance of the Laws does not constitute a valid excuse. The use of any illegal substances is prohibited. 10. Adopted CDC guidelines & recommendations: Target dosing levels will be at or below 60 MME/day. Use of benzodiazepines** is not recommended.  Exceptions: There are only two exceptions to the rule of not receiving pain medications from other Healthcare Providers. 1. Exception #1 (Emergencies): In the event of an emergency (i.e.: accident requiring emergency care), you are allowed to receive additional pain medication. However, you are responsible for: As soon as you are able, call our office [phone], at any time of the day or night, and leave a message stating your name,  the date and nature  of the emergency, and the name and dose of the medication prescribed. In the event that your call is answered by a member of our staff, make sure to document and save the date, time, and the name of the person that took your information.  2. Exception #2 (Planned Surgery): In the event that you are scheduled by another doctor or dentist to have any type of surgery or procedure, you are allowed (for a period no longer than 30 days), to receive additional pain medication, for the acute post-op pain. However, in this case, you are responsible for picking up a copy of our "Post-op Pain Management for Surgeons" handout, and giving it to your surgeon or dentist. This document is available at our office, and does not require an appointment to obtain it. Simply go to our office during business hours (Monday-Thursday from 8:00 AM to 4:00 PM) (Friday 8:00 AM to 12:00 Noon) or if you have a scheduled appointment with [place], prior to your surgery, and ask for it by name. In addition, you will need to provide [place] with your name, name of your surgeon, type of surgery, and date of procedure or surgery.  *Opioid medications include: morphine, codeine, oxycodone, oxymorphone, hydrocodone, hydromorphone, meperidine, tramadol, tapentadol, buprenorphine, fentanyl, methadone. **Benzodiazepine medications include: diazepam (Valium), alprazolam (Xanax), clonazepam (Klonopine), lorazepam (Ativan), clorazepate (Tranxene), chlordiazepoxide (Librium), estazolam (Prosom), oxazepam (Serax), temazepam (Restoril), triazolam (Halcion) (Last updated: 02/13/2018) ____________________________________________________________________________________________

## 2018-12-02 NOTE — Progress Notes (Signed)
HIGH Patient's Name: Natalie Lloyd  MRN: [id number]  Referring Provider: Steele Sizer, MD  DOB: Nov 28, 1946  PCP: Steele Sizer, MD  DOS: 12/02/2018  Note by: Vevelyn Francois NP  Service setting: Ambulatory outpatient  Specialty: Interventional Pain Management  Location: [facility] (AMB) Pain Management Facility    Patient type: Established    Primary Reason(s) for Visit: Encounter for prescription drug management. (Level of risk: moderate)  CC: No chief complaint on file.  HPI  Natalie Lloyd is a 72 y.o. year old, female patient, who comes today for a medication management evaluation. She has COPD (chronic obstructive pulmonary disease) ([place]); Hypertension; Sleep apnea; GERD (gastroesophageal reflux disease); Osteoporosis; History of pneumonia; Major depression, recurrent, chronic ([place]); Chronic insomnia; DDD (degenerative disc disease), lumbar; H/O cervical spine surgery; Disorder of tendon of right biceps; Pain in joint of right shoulder; Arthropathy of right shoulder; Chronic pain syndrome; and Cervical spondylosis on their problem list. Her primarily concern today is the No chief complaint on file.  Pain Assessment: Location: Right Neck Radiating: shoulderradiates to the elbow Duration: Chronic pain Quality: Aching, Shooting, Constant, Stabbing Severity: 6 /10 (subjective, self-reported pain score)  Note: Reported level is compatible with observation. Clinically the patient looks like a 1/10 A 1/10 is viewed as "Mild" and described as nagging, annoying, but not interfering with basic activities of daily living (ADL). Natalie Lloyd is able to eat, bathe, get dressed, do toileting (being able to get on and off the toilet and perform personal hygiene functions), transfer (move in and out of bed or a chair without assistance), and maintain continence (able to control bladder and bowel functions). Physiologic parameters such as blood pressure and heart rate apear wnl.       When using our objective Pain  Scale, levels between 6 and 10/10 are said to belong in an emergency room, as it progressively worsens from a 6/10, described as severely limiting, requiring emergency care not usually available at an outpatient pain management facility. At a 6/10 level, communication becomes difficult and requires great effort. Assistance to reach the emergency department may be required. Facial flushing and profuse sweating along with potentially dangerous increases in heart rate and blood pressure will be evident. Effect on ADL: lifting over 5 pounds, lifting, turning neck, dominant right hand,. "Everything I do with right hand" Timing:   Modifying factors:   BP: 119/67  HR: 67  Natalie Lloyd was last scheduled for an appointment on Visit date not found for medication management. During today's appointment we reviewed Natalie Lloyd's chronic pain status, as well as her outpatient medication regimen.  She is status post right shoulder surgery x4.  She is also status post cervical fusion.  She is in today for her second visit for medication management.  He is use hydrocodone 10/325 mg 3 times daily.  She has some numbness and tingling in her arm which is occasional. She does take the Gabapentin. She admits that she uses it one to three per day prn. She denies new concerns today.  The patient  reports previous drug use. Drug: IV. Her body mass index is 24.78 kg/m.  Further details on both, my assessment(s), as well as the proposed treatment plan, please see below.  Controlled Substance Pharmacotherapy Assessment REMS (Risk Evaluation and Mitigation Strategy)  Analgesic: None MME/day: 0 mg/day.  Natalie Specking, RN  12/02/2018  9:29 AM  Sign when Signing Visit Safety precautions to be maintained throughout the outpatient stay will include: orient to surroundings, keep bed  in low position, maintain call bell within reach at all times, provide assistance with transfer out of bed and ambulation.     Pharmacokinetics: Liberation and absorption (onset of action): WNL Distribution (time to peak effect): WNL Metabolism and excretion (duration of action): WNL         Pharmacodynamics: Desired effects: Analgesia: Natalie Lloyd reports >50% benefit. Functional ability: Patient reports that medication allows her to accomplish basic ADLs Clinically meaningful improvement in function (CMIF): Sustained CMIF goals met Perceived effectiveness: Described as relatively effective, allowing for increase in activities of daily living (ADL) Undesirable effects: Side-effects or Adverse reactions: None reported Monitoring: [place] PMP: Online review of the past 27-monthperiod conducted. Compliant with practice rules and regulations Last UDS on record: Summary  Date Value Ref Range Status  11/25/2018 FINAL  Final    Comment:    ==================================================================== [facility] COMP DRUG ANALYSIS,UR ==================================================================== Test                             Result       Flag       Units Drug Present and Declared for Prescription Verification   Hydrocodone                    35           EXPECTED   ng/mg creat    Sources of hydrocodone include scheduled prescription    medications.   Gabapentin                     PRESENT      EXPECTED   Paroxetine                     PRESENT      EXPECTED   Trazodone                      PRESENT      EXPECTED   1,3 chlorophenyl piperazine    PRESENT      EXPECTED    1,3-chlorophenyl piperazine is an expected metabolite of    trazodone.   Acetaminophen                  PRESENT      EXPECTED Drug Present not Declared for Prescription Verification   Cyclobenzaprine                PRESENT      UNEXPECTED   Desmethylcyclobenzaprine       PRESENT      UNEXPECTED    Desmethylcyclobenzaprine is an expected metabolite of    cyclobenzaprine.   Naproxen                       PRESENT      UNEXPECTED    Diphenhydramine                PRESENT      UNEXPECTED ==================================================================== Test                      Result    Flag   Units      Ref Range   Creatinine              145              mg/dL      >=20 ==================================================================== Declared Medications:  The flagging and interpretation on this report are based on the  following declared medications.  Unexpected results may arise from  inaccuracies in the declared medications.  **Note: The testing scope of this panel includes these medications:  Gabapentin  Hydrocodone (Hydrocodone-Acetaminophen)  Paroxetine  Trazodone  **Note: The testing scope of this panel does not include small to  moderate amounts of these reported medications:  Acetaminophen (Hydrocodone-Acetaminophen)  **Note: The testing scope of this panel does not include following  reported medications:  Albuterol  Amlodipine  Losartan  Pantoprazole ==================================================================== For clinical consultation, please call [phone]. ====================================================================    UDS interpretation: Compliant          Medication Assessment Form: Reviewed. Patient indicates being compliant with therapy Treatment compliance: Not applicable. Initial evaluation Risk Assessment Profile: Aberrant behavior: See prior evaluations. None observed or detected today Comorbid factors increasing risk of overdose: See prior notes. No additional risks detected today Opioid risk tool (ORT) (Total Score): 7 Personal History of Substance Abuse (SUD-Substance use disorder):  Alcohol:    Illegal Drugs: Positive Female or Female  Rx Drugs: Negative  ORT Risk Level calculation: Moderate Risk Risk of substance use disorder (SUD): Moderate-to-High Opioid Risk Tool - 12/02/18 0922      Family History of Substance Abuse   Alcohol  Negative     Illegal Drugs  Positive Female    Rx Drugs  Negative      Personal History of Substance Abuse   Illegal Drugs  Positive Female or Female    Rx Drugs  Negative      Age   Age between 80-45 years   No      History of Preadolescent Sexual Abuse   History of Preadolescent Sexual Abuse  Negative or Female      Psychological Disease   Psychological Disease  Negative    Depression  Positive      Total Score   Opioid Risk Tool Scoring  7    Opioid Risk Interpretation  Moderate Risk      ORT Scoring interpretation table:  Score <3 = Low Risk for SUD  Score between 4-7 = Moderate Risk for SUD  Score >8 = High Risk for Opioid Abuse   Risk Mitigation Strategies:  Patient Counseling: Covered Patient-Prescriber Agreement (PPA): Present and active  Notification to other healthcare providers: Done  Pharmacologic Plan: No change in therapy, at this time.             Laboratory Chemistry  Inflammation Markers (CRP: Acute Phase) (ESR: Chronic Phase) No results found for: CRP, ESRSEDRATE, LATICACIDVEN                       Rheumatology Markers No results found for: RF, ANA, LABURIC, URICUR, LYMEIGGIGMAB, LYMEABIGMQN, HLAB27                      Renal Function Markers No results found for: BUN, CREATININE, BCR, GFRAA, GFRNONAA, LABVMA, EPIRU, [id number], NOREPRU, NOREPI24HUR, DOPARU, [id number]                           Hepatic Function Markers No results found for: AST, ALT, ALBUMIN, ALKPHOS, HCVAB, AMYLASE, LIPASE, AMMONIA                      Electrolytes No results found for: NA, K, CL, CALCIUM, MG, PHOS  Neuropathy Markers No results found for: VITAMINB12, FOLATE, HGBA1C, HIV                      CNS Tests No results found for: COLORCSF, APPEARCSF, RBCCOUNTCSF, WBCCSF, POLYSCSF, LYMPHSCSF, EOSCSF, PROTEINCSF, GLUCCSF, JCVIRUS, CSFOLI, IGGCSF                      Bone Pathology Markers No results found for: VD25OH, [id number], [id number], [id number],  25OHVITD1, 25OHVITD2, 25OHVITD3, TESTOFREE, TESTOSTERONE                       Coagulation Parameters No results found for: INR, LABPROT, APTT, PLT, DDIMER, LABHEMA, VITAMINK1                      Cardiovascular Markers No results found for: BNP, CKTOTAL, CKMB, TROPONINI, HGB, HCT                       CA Markers No results found for: CEA, CA125, LABCA2                      Note: Lab results reviewed.  Recent Diagnostic Imaging Results  No image results found.  Complexity Note: Imaging results reviewed. Results shared with Natalie Lloyd, using Layman's terms.                         Meds   Current Outpatient Medications:  .  albuterol (PROVENTIL HFA;VENTOLIN HFA) 108 (90 Base) MCG/ACT inhaler, Inhale 2 puffs into the lungs every 6 (six) hours as needed for wheezing or shortness of breath., Disp: , Rfl:  .  amLODipine (NORVASC) 5 MG tablet, Take 1 tablet (5 mg total) by mouth daily., Disp: 90 tablet, Rfl: 1 .  gabapentin (NEURONTIN) 100 MG capsule, Take 100 mg by mouth 3 (three) times daily., Disp: , Rfl:  .  losartan (COZAAR) 100 MG tablet, Take 1 tablet (100 mg total) by mouth daily., Disp: 90 tablet, Rfl: 1 .  pantoprazole (PROTONIX) 40 MG tablet, Take 1 tablet (40 mg total) by mouth daily., Disp: 90 tablet, Rfl: 1 .  PARoxetine (PAXIL) 40 MG tablet, Take 1 tablet (40 mg total) by mouth every morning., Disp: 90 tablet, Rfl: 1 .  traZODone (DESYREL) 50 MG tablet, Take 50 mg by mouth at bedtime., Disp: , Rfl:  .  HYDROcodone-acetaminophen (NORCO) 10-325 MG tablet, Take 1 tablet by mouth every 8 (eight) hours as needed for severe pain., Disp: 90 tablet, Rfl: 0  ROS  Constitutional: Denies any fever or chills Gastrointestinal: No reported hemesis, hematochezia, vomiting, or acute GI distress Musculoskeletal: Denies any acute onset joint swelling, redness, loss of ROM, or weakness Neurological: No reported episodes of acute onset apraxia, aphasia, dysarthria, agnosia, amnesia, paralysis,  loss of coordination, or loss of consciousness  Allergies  Natalie Lloyd has No Known Allergies.  PFSH  Drug: Natalie Lloyd  reports previous drug use. Drug: IV. Alcohol:  reports current alcohol use. Tobacco:  reports that she has been smoking cigarettes. She has smoked for the past 50.00 years. She has never used smokeless tobacco. Medical:  has a past medical history of Anxiety, Arthritis, COPD (chronic obstructive pulmonary disease) ([place]), DDD (degenerative disc disease), lumbar, Depression, Generalized OA, GERD (gastroesophageal reflux disease), Hypertension, Osteoporosis, Sleep apnea, and Substance abuse ([place]). Surgical: Natalie Lloyd  has a past surgical history that includes Rotator  cuff repair; Appendectomy; Cervical spine surgery; and Carpal tunnel release. Family: family history is not on file.  Constitutional Exam  General appearance: Well nourished, well developed, and well hydrated. In no apparent acute distress Vitals:   12/02/18 0914  BP: 119/67  Pulse: 67  Resp: 18  Temp: 98 F (36.7 C)  SpO2: 100%  Weight: 163 lb (73.9 kg)  Height: '5\' 8"'  (1.727 m)  Psych/Mental status: Alert, oriented x 3 (person, place, & time)       Eyes: PERLA Respiratory: No evidence of acute respiratory distress  Cervical Spine Area Exam  Skin & Axial Inspection: Well healed scar from previous spine surgery detected Alignment: Symmetrical Functional ROM: Adequate ROM      Stability: No instability detected Muscle Tone/Strength: Functionally intact. No obvious neuro-muscular anomalies detected. Sensory (Neurological): Unimpaired Palpation: No palpable anomalies              Upper Extremity (UE) Exam    Side: Right upper extremity  Side: Left upper extremity  Skin & Extremity Inspection: Evidence of prior arthroplastic surgery  Skin & Extremity Inspection: Skin color, temperature, and hair growth are WNL. No peripheral edema or cyanosis. No masses, redness, swelling, asymmetry, or associated skin  lesions. No contractures.  Functional ROM: Restricted ROM          Functional ROM: Unrestricted ROM          Muscle Tone/Strength: Functionally intact. No obvious neuro-muscular anomalies detected.  Muscle Tone/Strength: Functionally intact. No obvious neuro-muscular anomalies detected.  Sensory (Neurological): Unimpaired          Sensory (Neurological): Unimpaired          Palpation: No palpable anomalies              Palpation: No palpable anomalies                   Thoracic Spine Area Exam  Skin & Axial Inspection: No masses, redness, or swelling Alignment: Symmetrical Functional ROM: Unrestricted ROM Stability: No instability detected Muscle Tone/Strength: Functionally intact. No obvious neuro-muscular anomalies detected. Sensory (Neurological): Unimpaired Muscle strength & Tone: No palpable anomalies   Gait & Posture Assessment  Ambulation: Unassisted Gait: Relatively normal for age and body habitus Posture: WNL   Lower Extremity Exam    Side: Right lower extremity  Side: Left lower extremity  Stability: No instability observed          Stability: No instability observed          Skin & Extremity Inspection: Skin color, temperature, and hair growth are WNL. No peripheral edema or cyanosis. No masses, redness, swelling, asymmetry, or associated skin lesions. No contractures.  Skin & Extremity Inspection: Skin color, temperature, and hair growth are WNL. No peripheral edema or cyanosis. No masses, redness, swelling, asymmetry, or associated skin lesions. No contractures.  Functional ROM: Unrestricted ROM                  Functional ROM: Unrestricted ROM                  Muscle Tone/Strength: Functionally intact. No obvious neuro-muscular anomalies detected.  Muscle Tone/Strength: Functionally intact. No obvious neuro-muscular anomalies detected.  Sensory (Neurological): Unimpaired        Sensory (Neurological): Unimpaired        Palpation: No palpable anomalies  Palpation: No  palpable anomalies   Assessment   Primary Diagnosis & Pertinent Problem List: The primary encounter diagnosis was Arthropathy of right shoulder.  Diagnoses of Pain in joint of right shoulder, Disorder of tendon of right biceps, Cervical spondylosis, and Chronic pain syndrome were also pertinent to this visit.  Status Diagnosis  Controlled Controlled Controlled 1. Arthropathy of right shoulder   2. Pain in joint of right shoulder   3. Disorder of tendon of right biceps   4. Cervical spondylosis   5. Chronic pain syndrome     Problems updated and reviewed during this visit: Problem  Cervical Spondylosis  H/O Cervical Spine Surgery  Disorder of Tendon of Right Biceps  Pain in Joint of Right Shoulder  Arthropathy of Right Shoulder  Chronic Pain Syndrome   Plan of Care  Pharmacotherapy (Medications Ordered): Meds ordered this encounter  Medications  . HYDROcodone-acetaminophen (NORCO) 10-325 MG tablet    Sig: Take 1 tablet by mouth every 8 (eight) hours as needed for severe pain.    Dispense:  90 tablet    Refill:  0    Do not place this medication, or any other prescription from our practice, on "Automatic Refill". Patient may have prescription filled one day early if pharmacy is closed on scheduled refill date.    Order Specific Question:   Supervising Provider    Answer:   Milinda Pointer [phone]   New Prescriptions   HYDROCODONE-ACETAMINOPHEN (NORCO) 10-325 MG TABLET    Take 1 tablet by mouth every 8 (eight) hours as needed for severe pain.   Medications administered today: Natalie Lloyd had no medications administered during this visit. Lab-work, procedure(s), and/or referral(s): No orders of the defined types were placed in this encounter.  Imaging and/or referral(s): None  Interventional therapies: Planned, scheduled, and/or pending:   Not at this time.   Considering:   Right posterior shoulder joint injection Right cervical facet medial branch nerve blocks  at C4, C5, C6, C7 Right trigger point injections, cervical/trapezius   Palliative PRN treatment(s):   Not at this time.    Provider-requested follow-up: Return in about 4 weeks (around 12/30/2018) for MedMgmt.  Future Appointments  Date Time Provider [place]  01/23/2019 10:20 AM Steele Sizer, MD [facility]  07/24/2019 10:40 AM [place] ADVISOR [place]   Primary Care Physician: Steele Sizer, MD Location: [facility] Note by: Vevelyn Francois NP Date: 12/02/2018; Time: 1:26 PM  Pain Score Disclaimer: We use the NRS-11 scale. This is a self-reported, subjective measurement of pain severity with only modest accuracy. It is used primarily to identify changes within a particular patient. It must be understood that outpatient pain scales are significantly less accurate that those used for research, where they can be applied under ideal controlled circumstances with minimal exposure to variables. In reality, the score is likely to be a combination of pain intensity and pain affect, where pain affect describes the degree of emotional arousal or changes in action readiness caused by the sensory experience of pain. Factors such as social and work situation, setting, emotional state, anxiety levels, expectation, and prior pain experience may influence pain perception and show large inter-individual differences that may also be affected by time variables.  Patient instructions provided during this appointment: Patient Instructions  ____________________________________________________________________________________________  Appointment Policy Summary  It is our goal and responsibility to provide the medical community with assistance in the evaluation and management of patients with chronic pain. Unfortunately our resources are limited. Because we do not have an unlimited amount of time, or available appointments, we are required to closely  monitor and manage their  use. The following rules exist to maximize their use:  Patient's responsibilities: 1. Punctuality:  At what time should I arrive? You should be physically present in our office 30 minutes before your scheduled appointment. Your scheduled appointment is with your assigned healthcare provider. However, it takes 5-10 minutes to be "checked-in", and another 15 minutes for the nurses to do the admission. If you arrive to our office at the time you were given for your appointment, you will end up being at least 20-25 minutes late to your appointment with the provider. 2. Tardiness:  What happens if I arrive only a few minutes after my scheduled appointment time? You will need to reschedule your appointment. The cutoff is your appointment time. This is why it is so important that you arrive at least 30 minutes before that appointment. If you have an appointment scheduled for 10:00 AM and you arrive at 10:01, you will be required to reschedule your appointment.  3. Plan ahead:  Always assume that you will encounter traffic on your way in. Plan for it. If you are dependent on a driver, make sure they understand these rules and the need to arrive early. 4. Other appointments and responsibilities:  Avoid scheduling any other appointments before or after your pain clinic appointments.  5. Be prepared:  Write down everything that you need to discuss with your healthcare provider and give this information to the admitting nurse. Write down the medications that you will need refilled. Bring your pills and bottles (even the empty ones), to all of your appointments, except for those where a procedure is scheduled. 6. No children or pets:  Find someone to take care of them. It is not appropriate to bring them in. 7. Scheduling changes:  We request "advanced notification" of any changes or cancellations. 8. Advanced notification:  Defined as a time period of more than 24 hours prior to the  originally scheduled appointment. This allows for the appointment to be offered to other patients. 9. Rescheduling:  When a visit is rescheduled, it will require the cancellation of the original appointment. For this reason they both fall within the category of "Cancellations".  10. Cancellations:  They require advanced notification. Any cancellation less than 24 hours before the  appointment will be recorded as a "No Show". 11. No Show:  Defined as an unkept appointment where the patient failed to notify or declare to the practice their intention or inability to keep the appointment.  Corrective process for repeat offenders:  1. Tardiness: Three (3) episodes of rescheduling due to late arrivals will be recorded as one (1) "No Show". 2. Cancellation or reschedule: Three (3) cancellations or rescheduling will be recorded as one (1) "No Show". 3. "No Shows": Three (3) "No Shows" within a 12 month period will result in discharge from the practice. ____________________________________________________________________________________________  ____________________________________________________________________________________________  Medication Rules  Purpose: To inform patients, and their family members, of our rules and regulations.  Applies to: All patients receiving prescriptions (written or electronic).  Pharmacy of record: Pharmacy where electronic prescriptions will be sent. If written prescriptions are taken to a different pharmacy, please inform the nursing staff. The pharmacy listed in the electronic medical record should be the one where you would like electronic prescriptions to be sent.  Electronic prescriptions: In compliance with the [place] (STOP) Act of 2017 (Session Lanny Cramp [phone]), effective December 17, 2018, all controlled substances must be electronically prescribed. Calling prescriptions to the pharmacy will cease to  exist.  Prescription refills: Only during scheduled appointments. Applies to all prescriptions.  NOTE: The following applies primarily to controlled substances (Opioid* Pain Medications).   Patient's responsibilities: 1. Pain Pills: Bring all pain pills to every appointment (except for procedure appointments). 2. Pill Bottles: Bring pills in original pharmacy bottle. Always bring the newest bottle. Bring bottle, even if empty. 3. Medication refills: You are responsible for knowing and keeping track of what medications you take and those you need refilled. The day before your appointment: write a list of all prescriptions that need to be refilled. The day of the appointment: give the list to the admitting nurse. Prescriptions will be written only during appointments. If you forget a medication: it will not be "Called in", "Faxed", or "electronically sent". You will need to get another appointment to get these prescribed. No early refills. Do not call asking to have your prescription filled early. 4. Prescription Accuracy: You are responsible for carefully inspecting your prescriptions before leaving our office. Have the discharge nurse carefully go over each prescription with you, before taking them home. Make sure that your name is accurately spelled, that your address is correct. Check the name and dose of your medication to make sure it is accurate. Check the number of pills, and the written instructions to make sure they are clear and accurate. Make sure that you are given enough medication to last until your next medication refill appointment. 5. Taking Medication: Take medication as prescribed. When it comes to controlled substances, taking less pills or less frequently than prescribed is permitted and encouraged. Never take more pills than instructed. Never take medication more frequently than prescribed.  6. Inform other Doctors: Always inform, all of your healthcare providers, of all the  medications you take. 7. Pain Medication from other Providers: You are not allowed to accept any additional pain medication from any other Doctor or Healthcare provider. There are two exceptions to this rule. (see below) In the event that you require additional pain medication, you are responsible for notifying us, as stated below. 8. Medication Agreement: You are responsible for carefully reading and following our Medication Agreement. This must be signed before receiving any prescriptions from our practice. Safely store a copy of your signed Agreement. Violations to the Agreement will result in no further prescriptions. (Additional copies of our Medication Agreement are available upon request.) 9. Laws, Rules, & Regulations: All patients are expected to follow all Federal and [employer], [employer], Rules, [employer]. Ignorance of the Laws does not constitute a valid excuse. The use of any illegal substances is prohibited. 10. Adopted CDC guidelines & recommendations: Target dosing levels will be at or below 60 MME/day. Use of benzodiazepines** is not recommended.  Exceptions: There are only two exceptions to the rule of not receiving pain medications from other Healthcare Providers. 1. Exception #1 (Emergencies): In the event of an emergency (i.e.: accident requiring emergency care), you are allowed to receive additional pain medication. However, you are responsible for: As soon as you are able, call our office [phone], at any time of the day or night, and leave a message stating your name, the date and nature of the emergency, and the name and dose of the medication prescribed. In the event that your call is answered by a member of our staff, make sure to document and save the date, time, and the name of the person that took your information.  2. Exception #2 (Planned Surgery): In the event that you are  scheduled by another doctor or dentist to have any type of surgery or procedure, you are  allowed (for a period no longer than 30 days), to receive additional pain medication, for the acute post-op pain. However, in this case, you are responsible for picking up a copy of our "Post-op Pain Management for Surgeons" handout, and giving it to your surgeon or dentist. This document is available at our office, and does not require an appointment to obtain it. Simply go to our office during business hours (Monday-Thursday from 8:00 AM to 4:00 PM) (Friday 8:00 AM to 12:00 Noon) or if you have a scheduled appointment with [place], prior to your surgery, and ask for it by name. In addition, you will need to provide [place] with your name, name of your surgeon, type of surgery, and date of procedure or surgery.  *Opioid medications include: morphine, codeine, oxycodone, oxymorphone, hydrocodone, hydromorphone, meperidine, tramadol, tapentadol, buprenorphine, fentanyl, methadone. **Benzodiazepine medications include: diazepam (Valium), alprazolam (Xanax), clonazepam (Klonopine), lorazepam (Ativan), clorazepate (Tranxene), chlordiazepoxide (Librium), estazolam (Prosom), oxazepam (Serax), temazepam (Restoril), triazolam (Halcion) (Last updated: 02/13/2018) ____________________________________________________________________________________________

## 2018-12-11 ENCOUNTER — Telehealth: Payer: Self-pay

## 2018-12-11 NOTE — Telephone Encounter (Signed)
Would like an appointment with Crystal for around January 16th for med refill.

## 2019-01-01 ENCOUNTER — Ambulatory Visit: Payer: MEDICARE | Attending: Nurse Practitioner | Admitting: Nurse Practitioner

## 2019-01-01 ENCOUNTER — Other Ambulatory Visit: Payer: Self-pay

## 2019-01-01 ENCOUNTER — Encounter: Payer: Self-pay | Admitting: Nurse Practitioner

## 2019-01-01 VITALS — BP 157/79 | HR 60 | Temp 98.0°F | Ht 68.0 in | Wt 163.0 lb

## 2019-01-01 DIAGNOSIS — G894 Chronic pain syndrome: Secondary | ICD-10-CM

## 2019-01-01 DIAGNOSIS — M5136 Other intervertebral disc degeneration, lumbar region: Secondary | ICD-10-CM

## 2019-01-01 DIAGNOSIS — M19011 Primary osteoarthritis, right shoulder: Secondary | ICD-10-CM | POA: Diagnosis not present

## 2019-01-01 DIAGNOSIS — M47812 Spondylosis without myelopathy or radiculopathy, cervical region: Secondary | ICD-10-CM

## 2019-01-01 MED ORDER — HYDROCODONE-ACETAMINOPHEN 10-325 MG PO TABS: 1.0000 | ORAL_TABLET | Freq: Three times a day (TID) | ORAL | 0 refills | Status: DC | PRN

## 2019-01-01 NOTE — Progress Notes (Signed)
Nursing Pain Medication Assessment:  Safety precautions to be maintained throughout the outpatient stay will include: orient to surroundings, keep bed in low position, maintain call bell within reach at all times, provide assistance with transfer out of bed and ambulation.  Medication Inspection Compliance: Pill count conducted under aseptic conditions, in front of the patient. Neither the pills nor the bottle was removed from the patient's sight at any time. Once count was completed pills were immediately returned to the patient in their original bottle.  Medication: Hydrocodone/APAP Pill/Patch Count: 0 of 90 pills remain Pill/Patch Appearance: Markings consistent with prescribed medication Bottle Appearance: Standard pharmacy container. Clearly labeled. Filled Date: 94 / 17 / 2019 Last Medication intake:  Today

## 2019-01-01 NOTE — Patient Instructions (Signed)
____________________________________________________________________________________________  Medication Rules  Purpose: To inform patients, and their family members, of our rules and regulations.  Applies to: All patients receiving prescriptions (written or electronic).  Pharmacy of record: Pharmacy where electronic prescriptions will be sent. If written prescriptions are taken to a different pharmacy, please inform the nursing staff. The pharmacy listed in the electronic medical record should be the one where you would like electronic prescriptions to be sent.  Electronic prescriptions: In compliance with the [place] Strengthen Opioid Misuse Prevention (STOP) Act of 2017 (Session Law 2017-74/H243), effective December 17, 2018, all controlled substances must be electronically prescribed. Calling prescriptions to the pharmacy will cease to exist.  Prescription refills: Only during scheduled appointments. Applies to all prescriptions.  NOTE: The following applies primarily to controlled substances (Opioid* Pain Medications).   Patient's responsibilities: 1. Pain Pills: Bring all pain pills to every appointment (except for procedure appointments). 2. Pill Bottles: Bring pills in original pharmacy bottle. Always bring the newest bottle. Bring bottle, even if empty. 3. Medication refills: You are responsible for knowing and keeping track of what medications you take and those you need refilled. The day before your appointment: write a list of all prescriptions that need to be refilled. The day of the appointment: give the list to the admitting nurse. Prescriptions will be written only during appointments. If you forget a medication: it will not be "Called in", "Faxed", or "electronically sent". You will need to get another appointment to get these prescribed. No early refills. Do not call asking to have your prescription filled early. 4. Prescription Accuracy: You are responsible for  carefully inspecting your prescriptions before leaving our office. Have the discharge nurse carefully go over each prescription with you, before taking them home. Make sure that your name is accurately spelled, that your address is correct. Check the name and dose of your medication to make sure it is accurate. Check the number of pills, and the written instructions to make sure they are clear and accurate. Make sure that you are given enough medication to last until your next medication refill appointment. 5. Taking Medication: Take medication as prescribed. When it comes to controlled substances, taking less pills or less frequently than prescribed is permitted and encouraged. Never take more pills than instructed. Never take medication more frequently than prescribed.  6. Inform other Doctors: Always inform, all of your healthcare providers, of all the medications you take. 7. Pain Medication from other Providers: You are not allowed to accept any additional pain medication from any other Doctor or Healthcare provider. There are two exceptions to this rule. (see below) In the event that you require additional pain medication, you are responsible for notifying us, as stated below. 8. Medication Agreement: You are responsible for carefully reading and following our Medication Agreement. This must be signed before receiving any prescriptions from our practice. Safely store a copy of your signed Agreement. Violations to the Agreement will result in no further prescriptions. (Additional copies of our Medication Agreement are available upon request.) 9. Laws, Rules, & Regulations: All patients are expected to follow all Federal and State Laws, Statutes, Rules, & Regulations. Ignorance of the Laws does not constitute a valid excuse. The use of any illegal substances is prohibited. 10. Adopted CDC guidelines & recommendations: Target dosing levels will be at or below 60 MME/day. Use of benzodiazepines** is not  recommended.  Exceptions: There are only two exceptions to the rule of not receiving pain medications from other Healthcare Providers. 1.   Exception #1 (Emergencies): In the event of an emergency (i.e.: accident requiring emergency care), you are allowed to receive additional pain medication. However, you are responsible for: As soon as you are able, call our office [phone], at any time of the day or night, and leave a message stating your name, the date and nature of the emergency, and the name and dose of the medication prescribed. In the event that your call is answered by a member of our staff, make sure to document and save the date, time, and the name of the person that took your information.  2. Exception #2 (Planned Surgery): In the event that you are scheduled by another doctor or dentist to have any type of surgery or procedure, you are allowed (for a period no longer than 30 days), to receive additional pain medication, for the acute post-op pain. However, in this case, you are responsible for picking up a copy of our "Post-op Pain Management for Surgeons" handout, and giving it to your surgeon or dentist. This document is available at our office, and does not require an appointment to obtain it. Simply go to our office during business hours (Monday-Thursday from 8:00 AM to 4:00 PM) (Friday 8:00 AM to 12:00 Noon) or if you have a scheduled appointment with us, prior to your surgery, and ask for it by name. In addition, you will need to provide us with your name, name of your surgeon, type of surgery, and date of procedure or surgery.  *Opioid medications include: morphine, codeine, oxycodone, oxymorphone, hydrocodone, hydromorphone, meperidine, tramadol, tapentadol, buprenorphine, fentanyl, methadone. **Benzodiazepine medications include: diazepam (Valium), alprazolam (Xanax), clonazepam (Klonopine), lorazepam (Ativan), clorazepate (Tranxene), chlordiazepoxide (Librium), estazolam (Prosom),  oxazepam (Serax), temazepam (Restoril), triazolam (Halcion) (Last updated: 02/13/2018) ____________________________________________________________________________________________    

## 2019-01-01 NOTE — Progress Notes (Signed)
Patient's Name: Natalie Lloyd  MRN: [id number]  Referring Provider: Steele Sizer, MD  DOB: Mar 14, 1946  PCP: Steele Sizer, MD  DOS: 01/01/2019  Note by: Vevelyn Francois NP  Service setting: Ambulatory outpatient  Specialty: Interventional Pain Management  Location: [facility] (AMB) Pain Management Facility    Patient type: Established    Primary Reason(s) for Visit: Encounter for prescription drug management. (Level of risk: moderate)  CC: Arm Pain (right)  HPI  Natalie Lloyd is a 73 y.o. year old, female patient, who comes today for a medication management evaluation. She has COPD (chronic obstructive pulmonary disease) ([place]); Hypertension; Sleep apnea; GERD (gastroesophageal reflux disease); Osteoporosis; History of pneumonia; Major depression, recurrent, chronic ([place]); Chronic insomnia; DDD (degenerative disc disease), lumbar; H/O cervical spine surgery; Disorder of tendon of right biceps; Pain in joint of right shoulder; Arthropathy of right shoulder; Chronic pain syndrome; and Cervical spondylosis on their problem list. Her primarily concern today is the Arm Pain (right)  Pain Assessment: Location: Right Neck(shoulder) Radiating: pain radiaties down to elbow Onset: More than a month ago Duration: Chronic pain Quality: Stabbing, Sharp, Shooting, Constant Severity: 6 /10 (subjective, self-reported pain score)  Note: Reported level is compatible with observation. Clinically the patient looks like a 1/10 A 1/10 is viewed as "Mild" and described as nagging, annoying, but not interfering with basic activities of daily living (ADL). Natalie Lloyd is able to eat, bathe, get dressed, do toileting (being able to get on and off the toilet and perform personal hygiene functions), transfer (move in and out of bed or a chair without assistance), and maintain continence (able to control bladder and bowel functions). Physiologic parameters such as blood pressure and heart rate apear wnl. Natalie Lloyd does not seem to  understand the use of our objective pain scale When using our objective Pain Scale, levels between 6 and 10/10 are said to belong in an emergency room, as it progressively worsens from a 6/10, described as severely limiting, requiring emergency care not usually available at an outpatient pain management facility. At a 6/10 level, communication becomes difficult and requires great effort. Assistance to reach the emergency department may be required. Facial flushing and profuse sweating along with potentially dangerous increases in heart rate and blood pressure will be evident. Effect on ADL: no lifting over 5 pounds, no turning neck, unable to do much with my right hand Timing: Constant Modifying factors: medications, elevate limb BP: (!) 157/79  HR: 60  Natalie Lloyd was last scheduled for an appointment on 12/02/2018 for medication management. During today's appointment we reviewed Natalie Lloyd's chronic pain status, as well as her outpatient medication regimen.  The patient  reports previous drug use. Drug: IV. Her body mass index is 24.78 kg/m.  Further details on both, my assessment(s), as well as the proposed treatment plan, please see below.  Controlled Substance Pharmacotherapy Assessment REMS (Risk Evaluation and Mitigation Strategy)  Analgesic: Hydrocodone/acetaminophen 10/325 mg 3 times daily MME/day: 30 mg/day.  Chauncey Fischer, RN  01/01/2019  9:14 AM  Sign when Signing Visit Nursing Pain Medication Assessment:  Safety precautions to be maintained throughout the outpatient stay will include: orient to surroundings, keep bed in low position, maintain call bell within reach at all times, provide assistance with transfer out of bed and ambulation.  Medication Inspection Compliance: Pill count conducted under aseptic conditions, in front of the patient. Neither the pills nor the bottle was removed from the patient's sight at any time. Once count was completed pills were  immediately returned to  the patient in their original bottle.  Medication: Hydrocodone/APAP Pill/Patch Count: 0 of 90 pills remain Pill/Patch Appearance: Markings consistent with prescribed medication Bottle Appearance: Standard pharmacy container. Clearly labeled. Filled Date: 48 / 17 / 2019 Last Medication intake:  Today   Pharmacokinetics: Liberation and absorption (onset of action): WNL Distribution (time to peak effect): WNL Metabolism and excretion (duration of action): WNL         Pharmacodynamics: Desired effects: Analgesia: Natalie Lloyd reports >50% benefit. Functional ability: Patient reports that medication allows her to accomplish basic ADLs Clinically meaningful improvement in function (CMIF): Sustained CMIF goals met Perceived effectiveness: Described as relatively effective, allowing for increase in activities of daily living (ADL) Undesirable effects: Side-effects or Adverse reactions: None reported Monitoring: [place] PMP: Online review of the past 48-monthperiod conducted. Compliant with practice rules and regulations Last UDS on record: Summary  Date Value Ref Range Status  11/25/2018 FINAL  Final    Comment:    ==================================================================== [facility] COMP DRUG ANALYSIS,UR ==================================================================== Test                             Result       Flag       Units Drug Present and Declared for Prescription Verification   Hydrocodone                    35           EXPECTED   ng/mg creat    Sources of hydrocodone include scheduled prescription    medications.   Gabapentin                     PRESENT      EXPECTED   Paroxetine                     PRESENT      EXPECTED   Trazodone                      PRESENT      EXPECTED   1,3 chlorophenyl piperazine    PRESENT      EXPECTED    1,3-chlorophenyl piperazine is an expected metabolite of    trazodone.   Acetaminophen                  PRESENT      EXPECTED Drug  Present not Declared for Prescription Verification   Cyclobenzaprine                PRESENT      UNEXPECTED   Desmethylcyclobenzaprine       PRESENT      UNEXPECTED    Desmethylcyclobenzaprine is an expected metabolite of    cyclobenzaprine.   Naproxen                       PRESENT      UNEXPECTED   Diphenhydramine                PRESENT      UNEXPECTED ==================================================================== Test                      Result    Flag   Units      Ref Range   Creatinine              145  mg/dL      >=20 ==================================================================== Declared Medications:  The flagging and interpretation on this report are based on the  following declared medications.  Unexpected results may arise from  inaccuracies in the declared medications.  **Note: The testing scope of this panel includes these medications:  Gabapentin  Hydrocodone (Hydrocodone-Acetaminophen)  Paroxetine  Trazodone  **Note: The testing scope of this panel does not include small to  moderate amounts of these reported medications:  Acetaminophen (Hydrocodone-Acetaminophen)  **Note: The testing scope of this panel does not include following  reported medications:  Albuterol  Amlodipine  Losartan  Pantoprazole ==================================================================== For clinical consultation, please call [phone]. ====================================================================    UDS interpretation: Compliant          Medication Assessment Form: Reviewed. Patient indicates being compliant with therapy Treatment compliance: Compliant Risk Assessment Profile: Aberrant behavior: See prior evaluations. None observed or detected today Comorbid factors increasing risk of overdose: See prior notes. No additional risks detected today Opioid risk tool (ORT) (Total Score): 8 Personal History of Substance Abuse (SUD-Substance use  disorder):  Alcohol: Negative  Illegal Drugs: Positive Female or Female  Rx Drugs: Negative  ORT Risk Level calculation: High Risk Risk of substance use disorder (SUD): High-to-Very High Opioid Risk Tool - 01/01/19 0925      Family History of Substance Abuse   Alcohol  Negative    Illegal Drugs  Positive Female    Rx Drugs  Negative      Personal History of Substance Abuse   Alcohol  Negative    Illegal Drugs  Positive Female or Female    Rx Drugs  Negative      Age   Age between 62-45 years   No      History of Preadolescent Sexual Abuse   History of Preadolescent Sexual Abuse  Negative or Female      Psychological Disease   Psychological Disease  Negative    Depression  Positive      Total Score   Opioid Risk Tool Scoring  8    Opioid Risk Interpretation  High Risk      ORT Scoring interpretation table:  Score <3 = Low Risk for SUD  Score between 4-7 = Moderate Risk for SUD  Score >8 = High Risk for Opioid Abuse   Risk Mitigation Strategies:  Patient Counseling: Covered Patient-Prescriber Agreement (PPA): Present and active  Notification to other healthcare providers: Done  Pharmacologic Plan: No change in therapy, at this time.             Laboratory Chemistry  Inflammation Markers (CRP: Acute Phase) (ESR: Chronic Phase) No results found for: CRP, ESRSEDRATE, LATICACIDVEN                       Rheumatology Markers No results found for: RF, ANA, LABURIC, URICUR, LYMEIGGIGMAB, LYMEABIGMQN, HLAB27                      Renal Function Markers No results found for: BUN, CREATININE, BCR, GFRAA, GFRNONAA, LABVMA, EPIRU, EPINEPH24HUR, NOREPRU, NOREPI24HUR, DOPARU, [id number]                           Hepatic Function Markers No results found for: AST, ALT, ALBUMIN, ALKPHOS, HCVAB, AMYLASE, LIPASE, AMMONIA                      Electrolytes No results found for: NA,  K, CL, CALCIUM, MG, PHOS                      Neuropathy Markers No results found for: VITAMINB12,  FOLATE, HGBA1C, HIV                      CNS Tests No results found for: COLORCSF, APPEARCSF, RBCCOUNTCSF, WBCCSF, POLYSCSF, LYMPHSCSF, EOSCSF, PROTEINCSF, GLUCCSF, JCVIRUS, CSFOLI, IGGCSF                      Bone Pathology Markers No results found for: VD25OH, [id number], [id number], DE0814GY1, 25OHVITD1, 25OHVITD2, 25OHVITD3, TESTOFREE, TESTOSTERONE                       Coagulation Parameters No results found for: INR, LABPROT, APTT, PLT, DDIMER, LABHEMA, VITAMINK1                      Cardiovascular Markers No results found for: BNP, CKTOTAL, CKMB, TROPONINI, HGB, HCT                       CA Markers No results found for: CEA, CA125, LABCA2                      Note: Lab results reviewed.  Recent Diagnostic Imaging Results  No image results found.  Complexity Note: Imaging results reviewed. Results shared with Ms. Offer, using Layman's terms.                         Meds   Current Outpatient Medications:  .  albuterol (PROVENTIL HFA;VENTOLIN HFA) 108 (90 Base) MCG/ACT inhaler, Inhale 2 puffs into the lungs every 6 (six) hours as needed for wheezing or shortness of breath., Disp: , Rfl:  .  amLODipine (NORVASC) 5 MG tablet, Take 1 tablet (5 mg total) by mouth daily., Disp: 90 tablet, Rfl: 1 .  gabapentin (NEURONTIN) 100 MG capsule, Take 100 mg by mouth 3 (three) times daily., Disp: , Rfl:  .  HYDROcodone-acetaminophen (NORCO) 10-325 MG tablet, Take 1 tablet by mouth every 8 (eight) hours as needed for up to 30 days for severe pain., Disp: 90 tablet, Rfl: 0 .  losartan (COZAAR) 100 MG tablet, Take 1 tablet (100 mg total) by mouth daily., Disp: 90 tablet, Rfl: 1 .  pantoprazole (PROTONIX) 40 MG tablet, Take 1 tablet (40 mg total) by mouth daily., Disp: 90 tablet, Rfl: 1 .  PARoxetine (PAXIL) 40 MG tablet, Take 1 tablet (40 mg total) by mouth every morning., Disp: 90 tablet, Rfl: 1 .  traZODone (DESYREL) 50 MG tablet, Take 50 mg by mouth at bedtime., Disp: , Rfl:   ROS   Constitutional: Denies any fever or chills Gastrointestinal: No reported hemesis, hematochezia, vomiting, or acute GI distress Musculoskeletal: Denies any acute onset joint swelling, redness, loss of ROM, or weakness Neurological: No reported episodes of acute onset apraxia, aphasia, dysarthria, agnosia, amnesia, paralysis, loss of coordination, or loss of consciousness  Allergies  Natalie Lloyd has No Known Allergies.  PFSH  Drug: Natalie Lloyd  reports previous drug use. Drug: IV. Alcohol:  reports current alcohol use. Tobacco:  reports that she has been smoking cigarettes. She has smoked for the past 50.00 years. She has never used smokeless tobacco. Medical:  has a past medical history of Anxiety, Arthritis, COPD (chronic obstructive pulmonary disease) ([place]), DDD (  degenerative disc disease), lumbar, Depression, Generalized OA, GERD (gastroesophageal reflux disease), Hypertension, Osteoporosis, Sleep apnea, and Substance abuse ([place]). Surgical: Natalie Lloyd  has a past surgical history that includes Rotator cuff repair; Appendectomy; Cervical spine surgery; and Carpal tunnel release. Family: family history is not on file.  Constitutional Exam  General appearance: Well nourished, well developed, and well hydrated. In no apparent acute distress Vitals:   01/01/19 0914  BP: (!) 157/79  Pulse: 60  Temp: 98 F (36.7 C)  SpO2: 97%  Weight: 163 lb (73.9 kg)  Height: '5\' 8"'  (1.727 m)  Psych/Mental status: Alert, oriented x 3 (person, place, & time)       Eyes: PERLA Respiratory: No evidence of acute respiratory distress  Cervical Spine Area Exam  Skin & Axial Inspection: No masses, redness, edema, swelling, or associated skin lesions Alignment: Symmetrical Functional ROM: Unrestricted ROM      Stability: No instability detected Muscle Tone/Strength: Functionally intact. No obvious neuro-muscular anomalies detected. Sensory (Neurological): Unimpaired Palpation: No palpable anomalies               Upper Extremity (UE) Exam    Side: Right upper extremity  Side: Left upper extremity  Skin & Extremity Inspection: Evidence of prior arthroplastic surgery  Skin & Extremity Inspection: Skin color, temperature, and hair growth are WNL. No peripheral edema or cyanosis. No masses, redness, swelling, asymmetry, or associated skin lesions. No contractures.  Functional ROM: Restricted ROM          Functional ROM: Unrestricted ROM          Muscle Tone/Strength: Generalized upper extremity weakness  Muscle Tone/Strength: Functionally intact. No obvious neuro-muscular anomalies detected.  Sensory (Neurological): Unimpaired          Sensory (Neurological): Unimpaired          Palpation: No palpable anomalies              Palpation: No palpable anomalies              Provocative Test(s):  Phalen's test: deferred Tinel's test: deferred Apley's scratch test (touch opposite shoulder):  Action 1 (Across chest): unable passive ROM only and limited Action 2 (Overhead): deferred Action 3 (LB reach): deferred   Provocative Test(s):  Phalen's test: deferred Tinel's test: deferred Apley's scratch test (touch opposite shoulder):  Action 1 (Across chest): deferred Action 2 (Overhead): deferred Action 3 (LB reach): deferred    Thoracic Spine Area Exam  Skin & Axial Inspection: No masses, redness, or swelling Alignment: Symmetrical Functional ROM: Unrestricted ROM Stability: No instability detected Muscle Tone/Strength: Functionally intact. No obvious neuro-muscular anomalies detected. Sensory (Neurological): Unimpaired Muscle strength & Tone: No palpable anomalies  Lumbar Spine Area Exam  Skin & Axial Inspection: No masses, redness, or swelling Alignment: Symmetrical Functional ROM: Unrestricted ROM       Stability: No instability detected Muscle Tone/Strength: Functionally intact. No obvious neuro-muscular anomalies detected. Sensory (Neurological): Unimpaired Palpation: No palpable anomalies        Provocative Tests: Hyperextension/rotation test: deferred today       Lumbar quadrant test (Kemp's test): deferred today       Lateral bending test: deferred today       Patrick's Maneuver: deferred today                    Gait & Posture Assessment  Ambulation: Unassisted Gait: Relatively normal for age and body habitus Posture: WNL   Lower Extremity Exam    Side: Right lower  extremity  Side: Left lower extremity  Stability: No instability observed          Stability: No instability observed          Skin & Extremity Inspection: Skin color, temperature, and hair growth are WNL. No peripheral edema or cyanosis. No masses, redness, swelling, asymmetry, or associated skin lesions. No contractures.  Skin & Extremity Inspection: Skin color, temperature, and hair growth are WNL. No peripheral edema or cyanosis. No masses, redness, swelling, asymmetry, or associated skin lesions. No contractures.  Functional ROM: Unrestricted ROM                  Functional ROM: Unrestricted ROM                  Muscle Tone/Strength: Functionally intact. No obvious neuro-muscular anomalies detected.  Muscle Tone/Strength: Functionally intact. No obvious neuro-muscular anomalies detected.  Sensory (Neurological): Unimpaired        Sensory (Neurological): Unimpaired            Palpation: No palpable anomalies  Palpation: No palpable anomalies   Assessment  Primary Diagnosis & Pertinent Problem List: The primary encounter diagnosis was Arthropathy of right shoulder. Diagnoses of Cervical spondylosis, DDD (degenerative disc disease), lumbar, and Chronic pain syndrome were also pertinent to this visit.  Status Diagnosis  Controlled Controlled Controlled 1. Arthropathy of right shoulder   2. Cervical spondylosis   3. DDD (degenerative disc disease), lumbar   4. Chronic pain syndrome     Problems updated and reviewed during this visit: No problems updated. Plan of Care  Pharmacotherapy (Medications  Ordered): Meds ordered this encounter  Medications  . HYDROcodone-acetaminophen (NORCO) 10-325 MG tablet    Sig: Take 1 tablet by mouth every 8 (eight) hours as needed for up to 30 days for severe pain.    Dispense:  90 tablet    Refill:  0    Do not place this medication, or any other prescription from our practice, on "Automatic Refill". Patient may have prescription filled one day early if pharmacy is closed on scheduled refill date.    Order Specific Question:   Supervising Provider    Answer:   Milinda Pointer [[id number]]   New Prescriptions   No medications on file   Medications administered today: Natalie Lloyd had no medications administered during this visit. Lab-work, procedure(s), and/or referral(s): No orders of the defined types were placed in this encounter.  Imaging and/or referral(s): None  Interventional therapies: Planned, scheduled, and/or pending:   Not at this time.   Considering:   Right posterior shoulder joint injection Right cervical facet medial branch nerve blocks at C4, C5, C6, C7 Right trigger point injections, cervical/trapezius   Palliative PRN treatment(s):   Not at this time.     Provider-requested follow-up: Return in about 3 weeks (around 01/22/2019) for MedMgmt.  Future Appointments  Date Time Provider [place]  01/23/2019 10:20 AM Steele Sizer, MD [facility]  07/24/2019 10:40 AM [place] ADVISOR [place]   Primary Care Physician: Steele Sizer, MD Location: [facility] Outpatient Pain Management Facility Note by: Vevelyn Francois NP Date: 01/01/2019; Time: 12:14 PM  Pain Score Disclaimer: We use the NRS-11 scale. This is a self-reported, subjective measurement of pain severity with only modest accuracy. It is used primarily to identify changes within a particular patient. It must be understood that outpatient pain scales are significantly less accurate that those used for research, where they can be applied  under  ideal controlled circumstances with minimal exposure to variables. In reality, the score is likely to be a combination of pain intensity and pain affect, where pain affect describes the degree of emotional arousal or changes in action readiness caused by the sensory experience of pain. Factors such as social and work situation, setting, emotional state, anxiety levels, expectation, and prior pain experience may influence pain perception and show large inter-individual differences that may also be affected by time variables.  Patient instructions provided during this appointment: Patient Instructions  ____________________________________________________________________________________________  Medication Rules  Purpose: To inform patients, and their family members, of our rules and regulations.  Applies to: All patients receiving prescriptions (written or electronic).  Pharmacy of record: Pharmacy where electronic prescriptions will be sent. If written prescriptions are taken to a different pharmacy, please inform the nursing staff. The pharmacy listed in the electronic medical record should be the one where you would like electronic prescriptions to be sent.  Electronic prescriptions: In compliance with the [place] (STOP) Act of 2017 (Session Lanny Cramp [id number]), effective December 17, 2018, all controlled substances must be electronically prescribed. Calling prescriptions to the pharmacy will cease to exist.  Prescription refills: Only during scheduled appointments. Applies to all prescriptions.  NOTE: The following applies primarily to controlled substances (Opioid* Pain Medications).   Patient's responsibilities: 1. Pain Pills: Bring all pain pills to every appointment (except for procedure appointments). 2. Pill Bottles: Bring pills in original pharmacy bottle. Always bring the newest bottle. Bring bottle, even if empty. 3. Medication refills:  You are responsible for knowing and keeping track of what medications you take and those you need refilled. The day before your appointment: write a list of all prescriptions that need to be refilled. The day of the appointment: give the list to the admitting nurse. Prescriptions will be written only during appointments. If you forget a medication: it will not be "Called in", "Faxed", or "electronically sent". You will need to get another appointment to get these prescribed. No early refills. Do not call asking to have your prescription filled early. 4. Prescription Accuracy: You are responsible for carefully inspecting your prescriptions before leaving our office. Have the discharge nurse carefully go over each prescription with you, before taking them home. Make sure that your name is accurately spelled, that your address is correct. Check the name and dose of your medication to make sure it is accurate. Check the number of pills, and the written instructions to make sure they are clear and accurate. Make sure that you are given enough medication to last until your next medication refill appointment. 5. Taking Medication: Take medication as prescribed. When it comes to controlled substances, taking less pills or less frequently than prescribed is permitted and encouraged. Never take more pills than instructed. Never take medication more frequently than prescribed.  6. Inform other Doctors: Always inform, all of your healthcare providers, of all the medications you take. 7. Pain Medication from other Providers: You are not allowed to accept any additional pain medication from any other Doctor or Healthcare provider. There are two exceptions to this rule. (see below) In the event that you require additional pain medication, you are responsible for notifying us, as stated below. 8. Medication Agreement: You are responsible for carefully reading and following our Medication Agreement. This must be signed  before receiving any prescriptions from our practice. Safely store a copy of your signed Agreement. Violations to the Agreement will result in no further prescriptions. (Additional  copies of our Medication Agreement are available upon request.) 9. Laws, Rules, & Regulations: All patients are expected to follow all Federal and [employer], Rules, [employer]. Ignorance of the Laws does not constitute a valid excuse. The use of any illegal substances is prohibited. 10. Adopted CDC guidelines & recommendations: Target dosing levels will be at or below 60 MME/day. Use of benzodiazepines** is not recommended.  Exceptions: There are only two exceptions to the rule of not receiving pain medications from other Healthcare Providers. 1. Exception #1 (Emergencies): In the event of an emergency (i.e.: accident requiring emergency care), you are allowed to receive additional pain medication. However, you are responsible for: As soon as you are able, call our office [phone], at any time of the day or night, and leave a message stating your name, the date and nature of the emergency, and the name and dose of the medication prescribed. In the event that your call is answered by a member of our staff, make sure to document and save the date, time, and the name of the person that took your information.  2. Exception #2 (Planned Surgery): In the event that you are scheduled by another doctor or dentist to have any type of surgery or procedure, you are allowed (for a period no longer than 30 days), to receive additional pain medication, for the acute post-op pain. However, in this case, you are responsible for picking up a copy of our "Post-op Pain Management for Surgeons" handout, and giving it to your surgeon or dentist. This document is available at our office, and does not require an appointment to obtain it. Simply go to our office during business hours (Monday-Thursday from 8:00 AM to 4:00 PM) (Friday 8:00  AM to 12:00 Noon) or if you have a scheduled appointment with [place], prior to your surgery, and ask for it by name. In addition, you will need to provide [place] with your name, name of your surgeon, type of surgery, and date of procedure or surgery.  *Opioid medications include: morphine, codeine, oxycodone, oxymorphone, hydrocodone, hydromorphone, meperidine, tramadol, tapentadol, buprenorphine, fentanyl, methadone. **Benzodiazepine medications include: diazepam (Valium), alprazolam (Xanax), clonazepam (Klonopine), lorazepam (Ativan), clorazepate (Tranxene), chlordiazepoxide (Librium), estazolam (Prosom), oxazepam (Serax), temazepam (Restoril), triazolam (Halcion) (Last updated: 02/13/2018) ____________________________________________________________________________________________

## 2019-01-22 ENCOUNTER — Encounter: Payer: Self-pay | Admitting: Nurse Practitioner

## 2019-01-23 ENCOUNTER — Encounter: Payer: Self-pay | Admitting: Family Medicine

## 2019-01-23 ENCOUNTER — Ambulatory Visit (INDEPENDENT_AMBULATORY_CARE_PROVIDER_SITE_OTHER): Payer: Medicare Other | Admitting: Family Medicine

## 2019-01-23 VITALS — BP 126/78 | HR 77 | Temp 98.7°F | Resp 16 | Ht 68.0 in | Wt 169.2 lb

## 2019-01-23 DIAGNOSIS — Z8701 Personal history of pneumonia (recurrent): Secondary | ICD-10-CM

## 2019-01-23 DIAGNOSIS — I10 Essential (primary) hypertension: Secondary | ICD-10-CM

## 2019-01-23 DIAGNOSIS — G894 Chronic pain syndrome: Secondary | ICD-10-CM

## 2019-01-23 DIAGNOSIS — F5104 Psychophysiologic insomnia: Secondary | ICD-10-CM

## 2019-01-23 DIAGNOSIS — F339 Major depressive disorder, recurrent, unspecified: Secondary | ICD-10-CM

## 2019-01-23 DIAGNOSIS — Z1159 Encounter for screening for other viral diseases: Secondary | ICD-10-CM

## 2019-01-23 DIAGNOSIS — J411 Mucopurulent chronic bronchitis: Secondary | ICD-10-CM

## 2019-01-23 DIAGNOSIS — Z9889 Other specified postprocedural states: Secondary | ICD-10-CM

## 2019-01-23 DIAGNOSIS — K219 Gastro-esophageal reflux disease without esophagitis: Secondary | ICD-10-CM

## 2019-01-23 MED ORDER — TRAZODONE HCL 50 MG PO TABS: 50.0000 mg | ORAL_TABLET | Freq: Every evening | ORAL | 0 refills | Status: DC | PRN

## 2019-01-23 NOTE — Progress Notes (Signed)
Name: Natalie Lloyd   MRN: [id number]    DOB: 11/30/1946   Date:01/23/2019       Progress Note  Subjective  Chief Complaint  Chief Complaint  Patient presents with  . Medication Refill  . COPD  . Hypertension  . Osteoporosis  . Gastroesophageal Reflux  . Depression  . Insomnia    HPI  COPD : diagnosed years ago, not on maintenance medication and does not want it at this time. She states very sick this past Spring, she had pneumonia Spring 2019 and was hospitalized in [place]. She states SOB and cough got worse after the pneumonia, but getting back to baseline. She has occasional cough at night, and also SOB with activity, normal spirometry today   HTN: she is taking medications, bp is at goal, no dizziness, chest pain or palpitation   Osteoporosis: she has not been taking medication, we will get records from [place] - we did not get it on her last visit   Chronic pain: she had multiple surgeries, she has DDD spine and also right shoulder surgery and history of dislocation, she was seeing pain clinic at [place], but now under the care of Dr. Cherylann RatelLateef and NP Marena Chancyhyrstal , taking hydrocodone TID, she denies constipation, denies somnolence.   GERD: she is on pantoprazole and she states no heartburn or regurgitation with medication, but unable to come off medication. She is aware of long term risk . Unchanged   Major Depression: she has a long history of depression, states she used to go weeks without living her house because of sadness. She started medication many years ago - about 15 years. Medication works well for her. She is happy living in [place] with her daughter Cala [place]. " I feel like I am in another world"  Insomnia: she takes Trazodone prn only and is doing well at this time. Unchanged    Patient Active Problem List   Diagnosis Date Noted  . H/O cervical spine surgery 12/02/2018  . Disorder of tendon of right biceps 12/02/2018  . Pain in joint of right shoulder  12/02/2018  . 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 Chronic pain syndrome 12/02/2018  . Cervical spondylosis 12/02/2018  . COPD (chronic obstructive pulmonary disease) (HCC) 10/22/2018  . Hypertension 10/22/2018  . Sleep apnea 10/22/2018  . GERD (gastroesophageal reflux disease) 10/22/2018  . Osteoporosis 10/22/2018  . History of pneumonia 10/22/2018  . Major depression, recurrent, chronic (HCC) 10/22/2018  . Chronic insomnia 10/22/2018  . DDD (degenerative disc disease), lumbar 10/22/2018    Past Surgical History:  Procedure Laterality Date  . APPENDECTOMY    . CARPAL TUNNEL RELEASE    . CERVICAL SPINE SURGERY    . ROTATOR CUFF REPAIR      History reviewed. No pertinent family history.  Social History   Socioeconomic History  . Marital status: Widowed    Spouse name: Benne  . Number of children: 5  . Years of education: Not on file  . Highest education level: Bachelor's degree (e.g., BA, AB, BS)  Occupational History  . Occupation: Disabled  Social Needs  . Financial resource strain: Somewhat hard  . Food insecurity:    Worry: Never true    Inability: Never true  . Transportation needs:    Medical: No    Non-medical: No  Tobacco Use  . Smoking status: Current Some Day Smoker    Years: 50.00    Types: Cigarettes  . Smokeless tobacco: Never Used  . Tobacco comment: 3-4 cigarettes  per day  Substance and Sexual Activity  . Alcohol use: Yes    Comment: occ wine cooler  . Drug use: Not Currently    Types: IV    Comment: 37.5 yrs ago  . Sexual activity: Not Currently    Partners: Male    Birth control/protection: None  Lifestyle  . Physical activity:    Days per week: 7 days    Minutes per session: 20 min  . Stress: Not at all  Relationships  . Social connections:    Talks on phone: More than three times a week    Gets together: Never    Attends religious service: 1 to 4 times per year    Active member of club or organization: No    Attends  meetings of clubs or organizations: Never    Relationship status: Widowed  . Intimate partner violence:    Fear of current or ex partner: No    Emotionally abused: No    Physically abused: No    Forced sexual activity: No  Other Topics Concern  . Not on file  Social History Narrative   Patient has 5 grown kids, 16 or 17 grandkids & about 3 great-grandkids.      Widowed for 4 yrs (10/6) now after 63yrs      Patient has moved here from [place].     Current Outpatient Medications:  .  albuterol (PROVENTIL HFA;VENTOLIN HFA) 108 (90 Base) MCG/ACT inhaler, Inhale 2 puffs into the lungs every 6 (six) hours as needed for wheezing or shortness of breath., Disp: , Rfl:  .  amLODipine (NORVASC) 5 MG tablet, Take 1 tablet (5 mg total) by mouth daily., Disp: 90 tablet, Rfl: 1 .  gabapentin (NEURONTIN) 100 MG capsule, Take 100 mg by mouth 3 (three) times daily., Disp: , Rfl:  .  HYDROcodone-acetaminophen (NORCO) 10-325 MG tablet, Take 1 tablet by mouth every 8 (eight) hours as needed for up to 30 days for severe pain., Disp: 90 tablet, Rfl: 0 .  losartan (COZAAR) 100 MG tablet, Take 1 tablet (100 mg total) by mouth daily., Disp: 90 tablet, Rfl: 1 .  pantoprazole (PROTONIX) 40 MG tablet, Take 1 tablet (40 mg total) by mouth daily., Disp: 90 tablet, Rfl: 1 .  PARoxetine (PAXIL) 40 MG tablet, Take 1 tablet (40 mg total) by mouth every morning., Disp: 90 tablet, Rfl: 1 .  traZODone (DESYREL) 50 MG tablet, Take 1 tablet (50 mg total) by mouth at bedtime as needed for sleep., Disp: 90 tablet, Rfl: 0  No Known Allergies  I personally reviewed active problem list, medication list, allergies, family history, social history with the patient/caregiver today.   ROS  Constitutional: Negative for fever or weight change.  Respiratory: Negative for cough , positive for intermittent shortness of breath.   Cardiovascular: Negative for chest pain or palpitations.  Gastrointestinal: Negative for abdominal  pain, no bowel changes.  Musculoskeletal: Negative for gait problem or joint swelling.  Skin: Negative for rash.  Neurological: Negative for dizziness or headache.  No other specific complaints in a complete review of systems (except as listed in HPI above).  Objective  Vitals:   01/23/19 1006  BP: 126/78  Pulse: 77  Resp: 16  Temp: 98.7 F (37.1 C)  TempSrc: Oral  SpO2: 93%  Weight: 169 lb 3.2 oz (76.7 kg)  Height: 5\' 8"  (1.727 m)    Body mass index is 25.73 kg/m.  Physical Exam  Constitutional: Patient appears well-developed and well-nourished.  No distress.  HEENT: head atraumatic, normocephalic, pupils equal and reactive to light, neck supple, throat within normal limits Cardiovascular: Normal rate, regular rhythm and normal heart sounds.  No murmur heard. No BLE edema. Pulmonary/Chest: Effort normal and breath sounds normal. No respiratory distress. Abdominal: Soft.  There is no tenderness. Psychiatric: Patient has a normal mood and affect. behavior is normal. Judgment and thought content normal.   PHQ2/9: Depression screen [facility] 2/9 01/23/2019 11/25/2018 10/22/2018  Decreased Interest 0 0 0  Down, Depressed, Hopeless 0 0 0  PHQ - 2 Score 0 0 0  Altered sleeping 1 - 0  Tired, decreased energy 1 - 1  Change in appetite 0 - 1  Feeling bad or failure about yourself  0 - 0  Trouble concentrating 0 - 1  Moving slowly or fidgety/restless 0 - 0  Suicidal thoughts 0 - 0  PHQ-9 Score 2 - 3  Difficult doing work/chores Not difficult at all - Not difficult at all     Fall Risk: Fall Risk  01/23/2019 01/01/2019 11/25/2018 10/22/2018  Falls in the past year? 0 0 0 1  Number falls in past yr: - - - 1  Injury with Fall? - - - 1  Risk for fall due to : Impaired balance/gait - - History of fall(s)    Functional Status Survey: Is the patient deaf or have difficulty hearing?: No Does the patient have difficulty seeing, even when wearing glasses/contacts?: No Does the patient  have difficulty concentrating, remembering, or making decisions?: No Does the patient have difficulty walking or climbing stairs?: Yes(difficulty with gait) Does the patient have difficulty dressing or bathing?: Yes Does the patient have difficulty doing errands alone such as visiting a doctor's office or shopping?: Yes(Daughter drives her around)    Assessment & Plan  1. Mucopurulent chronic bronchitis (HCC)  Doing well on prn medication  Spirometry showed normal FEV1/FVC - Spirometry with Graph  2. History of shoulder surgery  Still has pain, seeing pain clinic   3. History of pneumonia  - Spirometry with Graph  4. Chronic insomnia  - traZODone (DESYREL) 50 MG tablet; Take 1 tablet (50 mg total) by mouth at bedtime as needed for sleep.  Dispense: 90 tablet; Refill: 0  5. Major depression, recurrent, chronic (HCC)  Doing well on medication   6. Gastroesophageal reflux disease without esophagitis  Under control   7. Chronic pain syndrome  Seeing Dr. Cherylann Ratel and NP Crystal  Now   8. Essential hypertension  At goal   9. Need for hepatitis C screening test  - Hepatitis C Antibody

## 2019-01-26 LAB — PARATHYROID HORMONE, INTACT (NO CA): PTH: 72 pg/mL — ABNORMAL HIGH (ref 14–64)

## 2019-01-27 ENCOUNTER — Other Ambulatory Visit: Payer: Self-pay

## 2019-01-27 ENCOUNTER — Other Ambulatory Visit: Payer: Self-pay | Admitting: Family Medicine

## 2019-01-27 ENCOUNTER — Ambulatory Visit: Payer: MEDICARE | Attending: Nurse Practitioner | Admitting: Nurse Practitioner

## 2019-01-27 ENCOUNTER — Encounter: Payer: Self-pay | Admitting: Nurse Practitioner

## 2019-01-27 ENCOUNTER — Encounter: Payer: Self-pay | Admitting: Family Medicine

## 2019-01-27 VITALS — BP 132/82 | HR 63 | Temp 98.3°F | Resp 18 | Ht 68.0 in | Wt 169.0 lb

## 2019-01-27 DIAGNOSIS — M79601 Pain in right arm: Secondary | ICD-10-CM | POA: Diagnosis present

## 2019-01-27 DIAGNOSIS — M19011 Primary osteoarthritis, right shoulder: Secondary | ICD-10-CM | POA: Insufficient documentation

## 2019-01-27 DIAGNOSIS — G894 Chronic pain syndrome: Secondary | ICD-10-CM | POA: Insufficient documentation

## 2019-01-27 DIAGNOSIS — Z9889 Other specified postprocedural states: Secondary | ICD-10-CM | POA: Insufficient documentation

## 2019-01-27 DIAGNOSIS — G8929 Other chronic pain: Secondary | ICD-10-CM

## 2019-01-27 DIAGNOSIS — M47812 Spondylosis without myelopathy or radiculopathy, cervical region: Secondary | ICD-10-CM | POA: Diagnosis present

## 2019-01-27 DIAGNOSIS — E559 Vitamin D deficiency, unspecified: Secondary | ICD-10-CM | POA: Insufficient documentation

## 2019-01-27 DIAGNOSIS — R7989 Other specified abnormal findings of blood chemistry: Secondary | ICD-10-CM | POA: Insufficient documentation

## 2019-01-27 DIAGNOSIS — M79603 Pain in arm, unspecified: Secondary | ICD-10-CM

## 2019-01-27 DIAGNOSIS — M542 Cervicalgia: Secondary | ICD-10-CM | POA: Diagnosis not present

## 2019-01-27 DIAGNOSIS — Z79891 Long term (current) use of opiate analgesic: Secondary | ICD-10-CM | POA: Diagnosis present

## 2019-01-27 DIAGNOSIS — M79602 Pain in left arm: Secondary | ICD-10-CM | POA: Diagnosis present

## 2019-01-27 MED ORDER — HYDROCODONE-ACETAMINOPHEN 10-325 MG PO TABS: 1.0000 | ORAL_TABLET | Freq: Three times a day (TID) | ORAL | 0 refills | Status: DC | PRN

## 2019-01-27 MED ORDER — VITAMIN D (ERGOCALCIFEROL) 1.25 MG (50000 UNIT) PO CAPS: 50000.0000 [IU] | ORAL_CAPSULE | ORAL | 0 refills | Status: DC

## 2019-01-27 NOTE — Patient Instructions (Signed)
____________________________________________________________________________________________  Medication Rules  Purpose: To inform patients, and their family members, of our rules and regulations.  Applies to: All patients receiving prescriptions (written or electronic).  Pharmacy of record: Pharmacy where electronic prescriptions will be sent. If written prescriptions are taken to a different pharmacy, please inform the nursing staff. The pharmacy listed in the electronic medical record should be the one where you would like electronic prescriptions to be sent.  Electronic prescriptions: In compliance with the  Strengthen Opioid Misuse Prevention (STOP) Act of 2017 (Session Law 2017-74/H243), effective December 17, 2018, all controlled substances must be electronically prescribed. Calling prescriptions to the pharmacy will cease to exist.  Prescription refills: Only during scheduled appointments. Applies to all prescriptions.  NOTE: The following applies primarily to controlled substances (Opioid* Pain Medications).   Patient's responsibilities: 1. Pain Pills: Bring all pain pills to every appointment (except for procedure appointments). 2. Pill Bottles: Bring pills in original pharmacy bottle. Always bring the newest bottle. Bring bottle, even if empty. 3. Medication refills: You are responsible for knowing and keeping track of what medications you take and those you need refilled. The day before your appointment: write a list of all prescriptions that need to be refilled. The day of the appointment: give the list to the admitting nurse. Prescriptions will be written only during appointments. No prescriptions will be written on procedure days. If you forget a medication: it will not be "Called in", "Faxed", or "electronically sent". You will need to get another appointment to get these prescribed. No early refills. Do not call asking to have your prescription filled  early. 4. Prescription Accuracy: You are responsible for carefully inspecting your prescriptions before leaving our office. Have the discharge nurse carefully go over each prescription with you, before taking them home. Make sure that your name is accurately spelled, that your address is correct. Check the name and dose of your medication to make sure it is accurate. Check the number of pills, and the written instructions to make sure they are clear and accurate. Make sure that you are given enough medication to last until your next medication refill appointment. 5. Taking Medication: Take medication as prescribed. When it comes to controlled substances, taking less pills or less frequently than prescribed is permitted and encouraged. Never take more pills than instructed. Never take medication more frequently than prescribed.  6. Inform other Doctors: Always inform, all of your healthcare providers, of all the medications you take. 7. Pain Medication from other Providers: You are not allowed to accept any additional pain medication from any other Doctor or Healthcare provider. There are two exceptions to this rule. (see below) In the event that you require additional pain medication, you are responsible for notifying us, as stated below. 8. Medication Agreement: You are responsible for carefully reading and following our Medication Agreement. This must be signed before receiving any prescriptions from our practice. Safely store a copy of your signed Agreement. Violations to the Agreement will result in no further prescriptions. (Additional copies of our Medication Agreement are available upon request.) 9. Laws, Rules, & Regulations: All patients are expected to follow all Federal and State Laws, Statutes, Rules, & Regulations. Ignorance of the Laws does not constitute a valid excuse. The use of any illegal substances is prohibited. 10. Adopted CDC guidelines & recommendations: Target dosing levels will be  at or below 60 MME/day. Use of benzodiazepines** is not recommended.  Exceptions: There are only two exceptions to the rule of not   receiving pain medications from other Healthcare Providers. 1. Exception #1 (Emergencies): In the event of an emergency (i.e.: accident requiring emergency care), you are allowed to receive additional pain medication. However, you are responsible for: As soon as you are able, call our office [phone], at any time of the day or night, and leave a message stating your name, the date and nature of the emergency, and the name and dose of the medication prescribed. In the event that your call is answered by a member of our staff, make sure to document and save the date, time, and the name of the person that took your information.  2. Exception #2 (Planned Surgery): In the event that you are scheduled by another doctor or dentist to have any type of surgery or procedure, you are allowed (for a period no longer than 30 days), to receive additional pain medication, for the acute post-op pain. However, in this case, you are responsible for picking up a copy of our "Post-op Pain Management for Surgeons" handout, and giving it to your surgeon or dentist. This document is available at our office, and does not require an appointment to obtain it. Simply go to our office during business hours (Monday-Thursday from 8:00 AM to 4:00 PM) (Friday 8:00 AM to 12:00 Noon) or if you have a scheduled appointment with us, prior to your surgery, and ask for it by name. In addition, you will need to provide us with your name, name of your surgeon, type of surgery, and date of procedure or surgery.  *Opioid medications include: morphine, codeine, oxycodone, oxymorphone, hydrocodone, hydromorphone, meperidine, tramadol, tapentadol, buprenorphine, fentanyl, methadone. **Benzodiazepine medications include: diazepam (Valium), alprazolam (Xanax), clonazepam (Klonopine), lorazepam (Ativan), clorazepate  (Tranxene), chlordiazepoxide (Librium), estazolam (Prosom), oxazepam (Serax), temazepam (Restoril), triazolam (Halcion) (Last updated: 02/13/2018) ____________________________________________________________________________________________    

## 2019-01-27 NOTE — Progress Notes (Signed)
Nursing Pain Medication Assessment:  Safety precautions to be maintained throughout the outpatient stay will include: orient to surroundings, keep bed in low position, maintain call bell within reach at all times, provide assistance with transfer out of bed and ambulation.  Medication Inspection Compliance: Pill count conducted under aseptic conditions, in front of the patient. Neither the pills nor the bottle was removed from the patient's sight at any time. Once count was completed pills were immediately returned to the patient in their original bottle.  Medication: Hydrocodone/APAP Pill/Patch Count: 0 of 90 pills remain Pill/Patch Appearance: Markings consistent with prescribed medication Bottle Appearance: Standard pharmacy container. Clearly labeled. Filled Date: 01 / 16 / 2020 Last Medication intake:  Today   Patient states she has 12 pills in her drug box at home

## 2019-01-27 NOTE — Progress Notes (Signed)
Patient's Name: Natalie Lloyd  MRN: [id number]  Referring Provider: Steele Sizer, MD  DOB: [birth year]  PCP: Steele Sizer, MD  DOS: 01/27/2019  Note by: Dionisio David, NP  Service setting: Ambulatory outpatient  Specialty: Interventional Pain Management  Location: [facility] (AMB) Pain Management Facility    Patient type: Established   HPI  Reason for Visit: Natalie Lloyd is a 73 y.o. year old, female patient, who comes today with a chief complaint of Neck Pain and Back Pain Last Appointment: She was last seen by me on 01/01/2019. Pain Assessment: Today, Natalie Lloyd describes the severity of the Chronic pain as a 5 /10. She indicates the location/referral of the pain to be Neck Right/shoulders bilateral down arms to shoulders. right worse. Onset was:  Marland Kitchen The quality of pain is described as Aching, Sore, Throbbing, Constant, Numbness, Discomfort. Temporal description, or timing of pain is:  . Possible modifying factors: medications, elevate limb. [name] describes the pain effects on ADL as: lifting over 5 lbs anything with the right hand.  [name]  height is '5\' 8"'  (1.727 m) and weight is 169 lb (76.7 kg). Her temperature is 98.3 F (36.8 C). Her blood pressure is 132/82 and her pulse is 63. Her respiration is 18 and oxygen saturation is 100%. She is having increased neck pain with popping and clicking.  She feels like this is getting worse.  She is status post cervical fusion and admits to having a weak area in her neck.  Pain is going down into her shoulders and she does have some numbness tingling in her to middle fingers mostly but admits that each morning she has bilateral upper extremity numbness and tingling.  She feels like her ability to turn her head is getting worse. Controlled Substance Pharmacotherapy Assessment REMS (Risk Evaluation and Mitigation Strategy)  Analgesic: Hydrocodone/acetaminophen 10/325 mg 3 times daily MME/day: 30 mg/day [name], RN  01/27/2019  9:39 AM   Sign when Signing Visit Nursing Pain Medication Assessment:  Safety precautions to be maintained throughout the outpatient stay will include: orient to surroundings, keep bed in low position, maintain call bell within reach at all times, provide assistance with transfer out of bed and ambulation.  Medication Inspection Compliance: Pill count conducted under aseptic conditions, in front of the patient. Neither the pills nor the bottle was removed from the patient's sight at any time. Once count was completed pills were immediately returned to the patient in their original bottle.  Medication: Hydrocodone/APAP Pill/Patch Count: 0 of 90 pills remain Pill/Patch Appearance: Markings consistent with prescribed medication Bottle Appearance: Standard pharmacy container. Clearly labeled. Filled Date: 01 / 16 / 2020 Last Medication intake:  Today   Patient states she has 12 pills in her drug box at home   Pharmacokinetics: Liberation and absorption (onset of action): WNL Distribution (time to peak effect): WNL Metabolism and excretion (duration of action): WNL         Pharmacodynamics: Desired effects: Analgesia: [name] reports >50% benefit. Functional ability: Patient reports that medication allows her to accomplish basic ADLs Clinically meaningful improvement in function (CMIF): Sustained CMIF goals met Perceived effectiveness: Described as relatively effective, allowing for increase in activities of daily living (ADL) Undesirable effects: Side-effects or Adverse reactions: None reported Monitoring: [place] PMP: Online review of the past 56-monthperiod conducted. Compliant with practice rules and regulations Last UDS on record: Summary  Date Value Ref Range Status  11/25/2018 FINAL  Final    Comment:    ====================================================================  [facility] COMP DRUG ANALYSIS,UR ==================================================================== Test                              Result       Flag       Units Drug Present and Declared for Prescription Verification   Hydrocodone                    35           EXPECTED   ng/mg creat    Sources of hydrocodone include scheduled prescription    medications.   Gabapentin                     PRESENT      EXPECTED   Paroxetine                     PRESENT      EXPECTED   Trazodone                      PRESENT      EXPECTED   1,3 chlorophenyl piperazine    PRESENT      EXPECTED    1,3-chlorophenyl piperazine is an expected metabolite of    trazodone.   Acetaminophen                  PRESENT      EXPECTED Drug Present not Declared for Prescription Verification   Cyclobenzaprine                PRESENT      UNEXPECTED   Desmethylcyclobenzaprine       PRESENT      UNEXPECTED    Desmethylcyclobenzaprine is an expected metabolite of    cyclobenzaprine.   Naproxen                       PRESENT      UNEXPECTED   Diphenhydramine                PRESENT      UNEXPECTED ==================================================================== Test                      Result    Flag   Units      Ref Range   Creatinine              145              mg/dL      >=20 ==================================================================== Declared Medications:  The flagging and interpretation on this report are based on the  following declared medications.  Unexpected results may arise from  inaccuracies in the declared medications.  **Note: The testing scope of this panel includes these medications:  Gabapentin  Hydrocodone (Hydrocodone-Acetaminophen)  Paroxetine  Trazodone  **Note: The testing scope of this panel does not include small to  moderate amounts of these reported medications:  Acetaminophen (Hydrocodone-Acetaminophen)  **Note: The testing scope of this panel does not include following  reported medications:  Albuterol  Amlodipine  Losartan   Pantoprazole ==================================================================== For clinical consultation, please call [phone]. ====================================================================    UDS interpretation: Compliant          Medication Assessment Form: Reviewed. Patient indicates being compliant with therapy Treatment compliance: Compliant Risk Assessment Profile: Aberrant behavior: See initial evaluations. None observed or detected today Comorbid factors increasing risk  of overdose: See initial evaluation. No additional risks detected today Opioid risk tool (ORT):  Opioid Risk  01/27/2019  Alcohol 0  Illegal Drugs 3  Rx Drugs 0  Alcohol 0  Illegal Drugs 4  Rx Drugs 0  Age between 16-45 years  0  History of Preadolescent Sexual Abuse -  Psychological Disease 0  Depression 1  Opioid Risk Tool Scoring 8  Opioid Risk Interpretation High Risk    ORT Scoring interpretation table:  Score <3 = Low Risk for SUD  Score between 4-7 = Moderate Risk for SUD  Score >8 = High Risk for Opioid Abuse   Risk of substance use disorder (SUD): Low  Risk Mitigation Strategies:  Patient Counseling: Covered Patient-Prescriber Agreement (PPA): Present and active  Notification to other healthcare providers: Done  Pharmacologic Plan: No change in therapy, at this time.             ROS  Constitutional: Denies any fever or chills Gastrointestinal: No reported hemesis, hematochezia, vomiting, or acute GI distress Musculoskeletal: Denies any acute onset joint swelling, redness, loss of ROM, or weakness Neurological: No reported episodes of acute onset apraxia, aphasia, dysarthria, agnosia, amnesia, paralysis, loss of coordination, or loss of consciousness  Medication Review  HYDROcodone-acetaminophen, PARoxetine, albuterol, amLODipine, gabapentin, losartan, pantoprazole, and traZODone  History Review  Allergy: Ms. Cantu has No Known Allergies. Drug: Ms. Schwieger  reports  previous drug use. Drug: IV. Alcohol:  reports current alcohol use. Tobacco:  reports that she has been smoking cigarettes. She has smoked for the past 50.00 years. She has never used smokeless tobacco. Social: Ms. Alcoser  reports that she has been smoking cigarettes. She has smoked for the past 50.00 years. She has never used smokeless tobacco. She reports current alcohol use. She reports previous drug use. Drug: IV. Medical:  has a past medical history of Anxiety, Arthritis, COPD (chronic obstructive pulmonary disease) ([place]), DDD (degenerative disc disease), lumbar, Depression, Generalized OA, GERD (gastroesophageal reflux disease), Hypertension, Osteoporosis, Sleep apnea, and Substance abuse ([place]). Surgical: Ms. Bosque  has a past surgical history that includes Rotator cuff repair; Appendectomy; Cervical spine surgery; and Carpal tunnel release. Family: family history is not on file. Problem List: Ms. Macfarlane has Cervical spondylosis and Chronic upper extremity pain (R>L) on their pertinent problem list.  Lab Review  Kidney Function Lab Results  Component Value Date   BUN 12 01/23/2019   CREATININE 0.94 (H) 01/23/2019   BCR 13 01/23/2019   GFRAA 70 01/23/2019   GFRNONAA 60 01/23/2019  Liver Function Lab Results  Component Value Date   AST 25 01/23/2019   ALT 15 01/23/2019  Note: Above Lab results reviewed.  Imaging Review  No image results found. Note: Above imaging results reviewed.        Physical Exam  General appearance: Well nourished, well developed, and well hydrated. In no apparent acute distress Mental status: Alert, oriented x 3 (person, place, & time)       Respiratory: No evidence of acute respiratory distress Eyes: PERLA Vitals: BP 132/82   Pulse 63   Temp 98.3 F (36.8 C)   Resp 18   Ht '5\' 8"'  (1.727 m)   Wt 169 lb (76.7 kg)   SpO2 100%   BMI 25.70 kg/m  BMI: Estimated body mass index is 25.7 kg/m as calculated from the following:   Height as of this  encounter: '5\' 8"'  (1.727 m).   Weight as of this encounter: 169 lb (76.7 kg). Ideal: Ideal body  weight: 63.9 kg (140 lb 14 oz) Adjusted ideal body weight: 69 kg (152 lb 2 oz) Cervical Spine Area Exam  Skin & Axial Inspection: Well healed scar from previous spine surgery detected Alignment: Symmetrical Functional ROM: Decreased ROM      Stability: No instability detected Muscle Tone/Strength: Guarding observed Sensory (Neurological): Unimpaired Palpation: No palpable anomalies             Upper Extremity (UE) Exam    Side: Right upper extremity  Side: Left upper extremity  Skin & Extremity Inspection: Evidence of prior arthroplastic surgery  Skin & Extremity Inspection: Skin color, temperature, and hair growth are WNL. No peripheral edema or cyanosis. No masses, redness, swelling, asymmetry, or associated skin lesions. No contractures.  Functional ROM: Mechanically restricted ROM          Functional ROM: Unrestricted ROM          Muscle Tone/Strength: Movement possible against gravity, but not against resistance (3/5)  Muscle Tone/Strength: Functionally intact. No obvious neuro-muscular anomalies detected.  Sensory (Neurological): Movement-associated pain          Sensory (Neurological): Unimpaired          Palpation: No palpable anomalies              Palpation: No palpable anomalies                   Assessment   Status Diagnosis  Worsening Persistent Controlled 1. Cervical spondylosis   2. Arthropathy of right shoulder   3. Chronic pain syndrome   4. Long term current use of opiate analgesic   5. Cervicalgia   6. H/O cervical spine surgery   7. Chronic pain of both upper extremities      Updated Problems: Problem  Chronic upper extremity pain (R>L)  Long Term Current Use of Opiate Analgesic  Chronic Pain Syndrome  Ddd (Degenerative Disc Disease), Lumbar    Plan of Care  Medications: I am having Lamonte Richer maintain her albuterol, gabapentin, losartan, amLODipine,  PARoxetine, pantoprazole, traZODone, and HYDROcodone-acetaminophen.  Administered today: Lamonte Richer had no medications administered during this visit.  Orders:  Orders Placed This Encounter  Procedures  . CT CERVICAL SPINE WO CONTRAST    Standing Status:   Future    Standing Expiration Date:   04/27/2019    Order Specific Question:   Preferred imaging location?    Answer:   [facility]    Order Specific Question:   Call Results- Best Contact Number?    Answer:   [phone] ([facility])    Order Specific Question:   Radiology Contrast Protocol - do NOT remove file path    Answer:   [email or web address]   Interventional options: Planned follow-up:   Return in about 4 weeks (around 02/24/2019) for MedMgmt.   Considering: Right posterior shoulder joint injection Right cervical facet medial branch nerve blocks at C4, C5, C6, C7 Right trigger point injections, cervical/trapezius   Palliative PRN treatment(s): Not at this time.   Note by: Dionisio David, NP Date: 01/27/2019; Time: 10:43 AM

## 2019-01-29 ENCOUNTER — Other Ambulatory Visit: Payer: Self-pay | Admitting: Family Medicine

## 2019-01-29 LAB — COMPLETE METABOLIC PANEL WITH GFR
AG Ratio: 1.4 (calc) (ref 1.0–2.5)
ALT: 15 U/L (ref 6–29)
AST: 25 U/L (ref 10–35)
Albumin: 4.4 g/dL (ref 3.6–5.1)
Alkaline phosphatase (APISO): 63 U/L (ref 37–153)
BUN/Creatinine Ratio: 13 (calc) (ref 6–22)
BUN: 12 mg/dL (ref 7–25)
CO2: 24 mmol/L (ref 20–32)
Calcium: 9.4 mg/dL (ref 8.6–10.4)
Chloride: 102 mmol/L (ref 98–110)
Creat: 0.94 mg/dL — ABNORMAL HIGH (ref 0.60–0.93)
GFR, Est African American: 70 mL/min/{1.73_m2} (ref >=60)
GFR, Est Non African American: 60 mL/min/{1.73_m2} (ref >=60)
GLUCOSE: 86 mg/dL (ref 65–99)
Globulin: 3.2 g/dL (calc) (ref 1.9–3.7)
Potassium: 4.3 mmol/L (ref 3.5–5.3)
Sodium: 139 mmol/L (ref 135–146)
Total Bilirubin: 0.4 mg/dL (ref 0.2–1.2)
Total Protein: 7.6 g/dL (ref 6.1–8.1)

## 2019-01-29 LAB — HCV RNA,QUANTITATIVE REAL TIME PCR
HCV Quantitative Log: 6.25 Log IU/mL — ABNORMAL HIGH
HCV RNA, PCR, QN: 1770000 IU/mL — ABNORMAL HIGH

## 2019-01-29 LAB — CBC WITH DIFFERENTIAL/PLATELET
ABSOLUTE MONOCYTES: 511 {cells}/uL (ref 200–950)
Basophils Absolute: 104 cells/uL (ref 0–200)
Basophils Relative: 1.5 %
EOS ABS: 159 {cells}/uL (ref 15–500)
Eosinophils Relative: 2.3 %
HCT: 38 % (ref 35.0–45.0)
Hemoglobin: 12.4 g/dL (ref 11.7–15.5)
Lymphs Abs: 4188 cells/uL — ABNORMAL HIGH (ref 850–3900)
MCH: 27.6 pg (ref 27.0–33.0)
MCHC: 32.6 g/dL (ref 32.0–36.0)
MCV: 84.6 fL (ref 80.0–100.0)
MPV: 10.5 fL (ref 7.5–12.5)
Monocytes Relative: 7.4 %
Neutro Abs: 1939 cells/uL (ref 1500–7800)
Neutrophils Relative %: 28.1 %
Platelets: 264 10*3/uL (ref 140–400)
RBC: 4.49 10*6/uL (ref 3.80–5.10)
RDW: 14.9 % (ref 11.0–15.0)
Total Lymphocyte: 60.7 %
WBC: 6.9 10*3/uL (ref 3.8–10.8)

## 2019-01-29 LAB — LIPID PANEL
CHOL/HDL RATIO: 2.3 (calc) (ref <5.0)
Cholesterol: 158 mg/dL (ref <200)
HDL: 70 mg/dL (ref >=50)
LDL Cholesterol (Calc): 69 mg/dL (calc)
NON-HDL CHOLESTEROL (CALC): 88 mg/dL (ref <130)
Triglycerides: 111 mg/dL (ref <150)

## 2019-01-29 LAB — VITAMIN D 25 HYDROXY (VIT D DEFICIENCY, FRACTURES): Vit D, 25-Hydroxy: 10 ng/mL — ABNORMAL LOW (ref 30–100)

## 2019-01-29 LAB — TSH: TSH: 4.75 mIU/L — ABNORMAL HIGH (ref 0.40–4.50)

## 2019-01-29 LAB — HEPATITIS C ANTIBODY
Hepatitis C Ab: REACTIVE — AB
SIGNAL TO CUT-OFF: 32.7 — AB (ref <1.00)

## 2019-02-19 ENCOUNTER — Encounter: Payer: Self-pay | Admitting: Nurse Practitioner

## 2019-02-26 ENCOUNTER — Encounter: Payer: Self-pay | Admitting: Nurse Practitioner

## 2019-04-17 ENCOUNTER — Other Ambulatory Visit: Payer: Self-pay | Admitting: Family Medicine

## 2019-04-17 DIAGNOSIS — F339 Major depressive disorder, recurrent, unspecified: Secondary | ICD-10-CM

## 2019-04-17 DIAGNOSIS — K219 Gastro-esophageal reflux disease without esophagitis: Secondary | ICD-10-CM

## 2019-04-17 DIAGNOSIS — I10 Essential (primary) hypertension: Secondary | ICD-10-CM

## 2019-04-17 NOTE — Telephone Encounter (Signed)
Refill request for Hypertension medication:  Amlodipine 5 mg  Losartan 100 mg  Last office visit pertaining to hypertension: 01/23/2019  BP Readings from Last 3 Encounters:  01/27/19 132/82  01/23/19 126/78  01/01/19 (!) 157/79     Lab Results  Component Value Date   CREATININE 0.94 (H) 01/23/2019   BUN 12 01/23/2019   NA 139 01/23/2019   K 4.3 01/23/2019   CL 102 01/23/2019   CO2 24 01/23/2019   Follow-ups on file. 04/24/2019

## 2019-04-20 ENCOUNTER — Other Ambulatory Visit: Payer: Self-pay | Admitting: Family Medicine

## 2019-04-20 DIAGNOSIS — F5104 Psychophysiologic insomnia: Secondary | ICD-10-CM

## 2019-04-21 ENCOUNTER — Ambulatory Visit: Payer: Medicare Other | Attending: Nurse Practitioner | Admitting: Nurse Practitioner

## 2019-04-21 ENCOUNTER — Other Ambulatory Visit: Payer: Self-pay | Admitting: Family Medicine

## 2019-04-21 ENCOUNTER — Other Ambulatory Visit: Payer: Self-pay

## 2019-04-21 DIAGNOSIS — M47812 Spondylosis without myelopathy or radiculopathy, cervical region: Secondary | ICD-10-CM

## 2019-04-21 DIAGNOSIS — M5136 Other intervertebral disc degeneration, lumbar region: Secondary | ICD-10-CM

## 2019-04-21 DIAGNOSIS — M79601 Pain in right arm: Secondary | ICD-10-CM | POA: Diagnosis not present

## 2019-04-21 DIAGNOSIS — G8929 Other chronic pain: Secondary | ICD-10-CM | POA: Diagnosis not present

## 2019-04-21 DIAGNOSIS — M79602 Pain in left arm: Secondary | ICD-10-CM | POA: Diagnosis not present

## 2019-04-21 DIAGNOSIS — M19011 Primary osteoarthritis, right shoulder: Secondary | ICD-10-CM

## 2019-04-21 MED ORDER — HYDROCODONE-ACETAMINOPHEN 10-325 MG PO TABS: 1.0000 | ORAL_TABLET | Freq: Three times a day (TID) | ORAL | 0 refills | Status: DC | PRN

## 2019-04-21 NOTE — Patient Instructions (Signed)
____________________________________________________________________________________________  Medication Rules  Purpose: To inform patients, and their family members, of our rules and regulations.  Applies to: All patients receiving prescriptions (written or electronic).  Pharmacy of record: Pharmacy where electronic prescriptions will be sent. If written prescriptions are taken to a different pharmacy, please inform the nursing staff. The pharmacy listed in the electronic medical record should be the one where you would like electronic prescriptions to be sent.  Electronic prescriptions: In compliance with the [place] Strengthen Opioid Misuse Prevention (STOP) Act of 2017 (Session Law 2017-74/H243), effective December 17, 2018, all controlled substances must be electronically prescribed. Calling prescriptions to the pharmacy will cease to exist.  Prescription refills: Only during scheduled appointments. Applies to all prescriptions.  NOTE: The following applies primarily to controlled substances (Opioid* Pain Medications).   Patient's responsibilities: 1. Pain Pills: Bring all pain pills to every appointment (except for procedure appointments). 2. Pill Bottles: Bring pills in original pharmacy bottle. Always bring the newest bottle. Bring bottle, even if empty. 3. Medication refills: You are responsible for knowing and keeping track of what medications you take and those you need refilled. The day before your appointment: write a list of all prescriptions that need to be refilled. The day of the appointment: give the list to the admitting nurse. Prescriptions will be written only during appointments. No prescriptions will be written on procedure days. If you forget a medication: it will not be "Called in", "Faxed", or "electronically sent". You will need to get another appointment to get these prescribed. No early refills. Do not call asking to have your prescription filled  early. 4. Prescription Accuracy: You are responsible for carefully inspecting your prescriptions before leaving our office. Have the discharge nurse carefully go over each prescription with you, before taking them home. Make sure that your name is accurately spelled, that your address is correct. Check the name and dose of your medication to make sure it is accurate. Check the number of pills, and the written instructions to make sure they are clear and accurate. Make sure that you are given enough medication to last until your next medication refill appointment. 5. Taking Medication: Take medication as prescribed. When it comes to controlled substances, taking less pills or less frequently than prescribed is permitted and encouraged. Never take more pills than instructed. Never take medication more frequently than prescribed.  6. Inform other Doctors: Always inform, all of your healthcare providers, of all the medications you take. 7. Pain Medication from other Providers: You are not allowed to accept any additional pain medication from any other Doctor or Healthcare provider. There are two exceptions to this rule. (see below) In the event that you require additional pain medication, you are responsible for notifying us, as stated below. 8. Medication Agreement: You are responsible for carefully reading and following our Medication Agreement. This must be signed before receiving any prescriptions from our practice. Safely store a copy of your signed Agreement. Violations to the Agreement will result in no further prescriptions. (Additional copies of our Medication Agreement are available upon request.) 9. Laws, Rules, & Regulations: All patients are expected to follow all Federal and State Laws, Statutes, Rules, & Regulations. Ignorance of the Laws does not constitute a valid excuse. The use of any illegal substances is prohibited. 10. Adopted CDC guidelines & recommendations: Target dosing levels will be  at or below 60 MME/day. Use of benzodiazepines** is not recommended.  Exceptions: There are only two exceptions to the rule of not   receiving pain medications from other Healthcare Providers. 1. Exception #1 (Emergencies): In the event of an emergency (i.e.: accident requiring emergency care), you are allowed to receive additional pain medication. However, you are responsible for: As soon as you are able, call our office [phone], at any time of the day or night, and leave a message stating your name, the date and nature of the emergency, and the name and dose of the medication prescribed. In the event that your call is answered by a member of our staff, make sure to document and save the date, time, and the name of the person that took your information.  2. Exception #2 (Planned Surgery): In the event that you are scheduled by another doctor or dentist to have any type of surgery or procedure, you are allowed (for a period no longer than 30 days), to receive additional pain medication, for the acute post-op pain. However, in this case, you are responsible for picking up a copy of our "Post-op Pain Management for Surgeons" handout, and giving it to your surgeon or dentist. This document is available at our office, and does not require an appointment to obtain it. Simply go to our office during business hours (Monday-Thursday from 8:00 AM to 4:00 PM) (Friday 8:00 AM to 12:00 Noon) or if you have a scheduled appointment with us, prior to your surgery, and ask for it by name. In addition, you will need to provide us with your name, name of your surgeon, type of surgery, and date of procedure or surgery.  *Opioid medications include: morphine, codeine, oxycodone, oxymorphone, hydrocodone, hydromorphone, meperidine, tramadol, tapentadol, buprenorphine, fentanyl, methadone. **Benzodiazepine medications include: diazepam (Valium), alprazolam (Xanax), clonazepam (Klonopine), lorazepam (Ativan), clorazepate  (Tranxene), chlordiazepoxide (Librium), estazolam (Prosom), oxazepam (Serax), temazepam (Restoril), triazolam (Halcion) (Last updated: 02/13/2018) ____________________________________________________________________________________________    

## 2019-04-21 NOTE — Progress Notes (Signed)
Pain Management Encounter Note - Virtual Visit via Telephone Telehealth (real-time audio visits between healthcare provider and patient).  Patient's Phone No. & Preferred Pharmacy:  [phone] (home); [phone] (mobile); (Preferred) [phone]  [facility][id number] [place], [place] - [address] AT [facility] [address] [place] [address] [place] [address] Phone: [phone] Fax: [phone]   Pre-screening note:  Our staff contacted Natalie Lloyd and offered her an "in person", "face-to-face" appointment versus a telephone encounter. She indicated preferring the telephone encounter, at this time.  Reason for Virtual Visit: COVID-19*  Social distancing based on CDC and AMA recommendations.   I contacted Natalie Lloyd on 04/21/2019 at 8:16 AM by telephone and clearly identified myself as Thad Ranger, NP. I verified that I was speaking with the correct person using two identifiers (Name and date of birth: 25-Nov-1946).  Advanced Informed Consent I sought verbal advanced consent from Natalie Lloyd for telemedicine interactions and virtual visit. I informed Natalie Lloyd of the security and privacy concerns, risks, and limitations associated with performing an evaluation and management service by telephone. I also informed Natalie Lloyd of the availability of "in person" appointments and I informed her of the possibility of a patient responsible charge related to this service. Ms. Mcmanaway expressed understanding and agreed to proceed.   Historic Elements   [name] is a 73 y.o. year old, female patient evaluated today after her last encounter by our practice on 01/27/2019. [name]  has a past medical history of Anxiety, Arthritis, COPD (chronic obstructive pulmonary disease) (HCC), DDD (degenerative disc disease), lumbar, Depression, Generalized OA, GERD (gastroesophageal reflux disease), Hypertension, Osteoporosis, Sleep apnea, and Substance abuse (HCC). She also  has a  past surgical history that includes Rotator cuff repair; Appendectomy; Cervical spine surgery; and Carpal tunnel release. [name] has a current medication list which includes the following prescription(s): albuterol, amlodipine, gabapentin, losartan, pantoprazole, paroxetine, trazodone, and vitamin d (ergocalciferol). She  reports that she has been smoking cigarettes. She has smoked for the past 50.00 years. She has never used smokeless tobacco. She reports current alcohol use. She reports previous drug use. Drug: IV. [name] has No Known Allergies.   HPI  I last saw her on 01/27/2019. She is being evaluated for medication management. She is having increased pain because she has not had any medication. She irates her a 8/10. She had shoulder and knee pain. She admits that she is grateful just to waking up. She having a lot of stiffness. She amdits that she continues to right shoulder weakness and shiftness also. She admits that she has gotten her insurance issue corrected.  Pharmacotherapy Assessment  Analgesic:Hydrocodone/acetaminophen 10/325 mg 3 times daily MME/day:30mg /day  Monitoring: Pharmacotherapy: No side-effects or adverse reactions reported. [place] PMP: PDMP not reviewed this encounter.       Compliance: No problems identified. Plan: Refer to "POC".  Review of recent tests  No image results found.   Office Visit on 01/23/2019  Component Date Value Ref Range Status  . Hepatitis C Ab 01/23/2019 REACTIVE* NON-REACTI Final  . SIGNAL TO CUT-OFF 01/23/2019 32.70* <1.00 Final   Comment: . HCV antibody was reactive. The sample will be tested for HCV RNA by a Nucleic Acid Amplification Test (NAAT) to determine if the patient has a current active infection. .   . Cholesterol 01/23/2019 158  <200 mg/dL Final  . HDL 40/34/7425 70  > OR = 50 mg/dL Final  . Triglycerides 01/23/2019 111  <  150 mg/dL Final  . LDL Cholesterol (Calc) 01/23/2019 69  mg/dL (calc) Final   Comment: Reference  range: <100 . Desirable range <100 mg/dL for primary prevention;   <70 mg/dL for patients with CHD or diabetic patients  with > or = 2 CHD risk factors. Marland Kitchen LDL-C is now calculated using the Martin-Hopkins  calculation, which is a validated novel method providing  better accuracy than the Friedewald equation in the  estimation of LDL-C.  Horald Pollen et al. Lenox Ahr. 7989;211(94): 2061-2068  (http://education.Quest[facility].com/faq/FAQ164)   . Total CHOL/HDL Ratio 01/23/2019 2.3  <1.7 (calc) Final  . Non-HDL Cholesterol (Calc) 01/23/2019 88  <130 mg/dL (calc) Final   Comment: For patients with diabetes plus 1 major ASCVD risk  factor, treating to a non-HDL-C goal of <100 mg/dL  (LDL-C of <40 mg/dL) is considered a therapeutic  option.   . Glucose, Bld 01/23/2019 86  65 - 99 mg/dL Final   Comment: .            Fasting reference interval .   . BUN 01/23/2019 12  7 - 25 mg/dL Final  . Creat 81/44/8185 0.94* 0.60 - 0.93 mg/dL Final   Comment: For patients >13 years of age, the reference limit for Creatinine is approximately 13% higher for people identified as African-American. .   . GFR, Est Non African American 01/23/2019 60  > OR = 60 mL/min/1.12m2 Final  . GFR, Est African American 01/23/2019 70  > OR = 60 mL/min/1.47m2 Final  . BUN/Creatinine Ratio 01/23/2019 13  6 - 22 (calc) Final  . Sodium 01/23/2019 139  135 - 146 mmol/L Final  . Potassium 01/23/2019 4.3  3.5 - 5.3 mmol/L Final  . Chloride 01/23/2019 102  98 - 110 mmol/L Final  . CO2 01/23/2019 24  20 - 32 mmol/L Final  . Calcium 01/23/2019 9.4  8.6 - 10.4 mg/dL Final  . Total Protein 01/23/2019 7.6  6.1 - 8.1 g/dL Final  . Albumin 63/14/9702 4.4  3.6 - 5.1 g/dL Final  . Globulin 63/78/5885 3.2  1.9 - 3.7 g/dL (calc) Final  . AG Ratio 01/23/2019 1.4  1.0 - 2.5 (calc) Final  . Total Bilirubin 01/23/2019 0.4  0.2 - 1.2 mg/dL Final  . Alkaline phosphatase (APISO) 01/23/2019 63  37 - 153 U/L Final  . AST 01/23/2019 25  10 - 35  U/L Final  . ALT 01/23/2019 15  6 - 29 U/L Final  . Vit D, 25-Hydroxy 01/23/2019 10* 30 - 100 ng/mL Final   Comment: Vitamin D Status         25-OH Vitamin D: . Deficiency:                    <20 ng/mL Insufficiency:             20 - 29 ng/mL Optimal:                 > or = 30 ng/mL . For 25-OH Vitamin D testing on patients on  D2-supplementation and patients for whom quantitation  of D2 and D3 fractions is required, the QuestAssureD(TM) 25-OH VIT D, (D2,D3), LC/MS/MS is recommended: order  code 02774 (patients >69yrs). . For more information on this test, go to: http://education.questdiagnostics.com/faq/FAQ163 (This link is being provided for  informational/educational purposes only.)   . TSH 01/23/2019 4.75* 0.40 - 4.50 mIU/L Final  . WBC 01/23/2019 6.9  3.8 - 10.8 Thousand/uL Final  . RBC 01/23/2019 4.49  3.80 - 5.10 Million/uL  Final  . Hemoglobin 01/23/2019 12.4  11.7 - 15.5 g/dL Final  . HCT 29/56/2130 38.0  35.0 - 45.0 % Final  . MCV 01/23/2019 84.6  80.0 - 100.0 fL Final  . MCH 01/23/2019 27.6  27.0 - 33.0 pg Final  . MCHC 01/23/2019 32.6  32.0 - 36.0 g/dL Final  . RDW 86/57/8469 14.9  11.0 - 15.0 % Final  . Platelets 01/23/2019 264  140 - 400 Thousand/uL Final  . MPV 01/23/2019 10.5  7.5 - 12.5 fL Final  . Neutro Abs 01/23/2019 1,939  1,500 - 7,800 cells/uL Final  . Lymphs Abs 01/23/2019 4,188* 850 - 3,900 cells/uL Final  . Absolute Monocytes 01/23/2019 511  200 - 950 cells/uL Final  . Eosinophils Absolute 01/23/2019 159  15 - 500 cells/uL Final  . Basophils Absolute 01/23/2019 104  0 - 200 cells/uL Final  . Neutrophils Relative % 01/23/2019 28.1  % Final  . Total Lymphocyte 01/23/2019 60.7  % Final  . Monocytes Relative 01/23/2019 7.4  % Final  . Eosinophils Relative 01/23/2019 2.3  % Final  . Basophils Relative 01/23/2019 1.5  % Final  . PTH 01/23/2019 72* 14 - 64 pg/mL Final   Comment: . Interpretive Guide    Intact PTH           Calcium ------------------     ----------           ------- Normal Parathyroid    Normal               Normal Hypoparathyroidism    Low or Low Normal    Low Hyperparathyroidism    Primary            Normal or High       High    Secondary          High                 Normal or Low    Tertiary           High                 High Non-Parathyroid    Hypercalcemia      Low or Low Normal    High .   Marland Kitchen HCV RNA, PCR, QN 01/23/2019 1,770,000* NOT DETECT IU/mL Final  . HCV Quantitative Log 01/23/2019 6.25* NOT DETECT Log IU/mL Final   Comment: . HCV RNA was detected. This result provides laboratory  evidence of a current active HCV infection. . . This test was performed using Real-Time Polymerase Chain Reaction. . Reportable Range: 15 IU/mL to 100,000,000 IU/mL (1.18 Log IU/mL to 8.00 Log IU/mL). . The analytical performance characteristics of this assay have been determined by [facility].  The modifications have not been cleared or approved by the FDA. This assay has been validated pursuant to the  CLIA regulations and is used for clinical purposes.   . For more information on this test, go to: http://education.questdiagnostics.com/faq/FAQ22v1 (This link is being provided for informational/ educational purposes only.) . This assay is intended for use as an aid in the diagnosis of HCV infection and the management of HCV infected patients undergoing anti-viral therapy. .    Assessment  There were no encounter diagnoses.  Plan of Care  I am having Natalie Lloyd maintain her albuterol, gabapentin, losartan, amLODipine, PARoxetine, pantoprazole, traZODone, and Vitamin D (Ergocalciferol).  Pharmacotherapy (Medications Ordered): No orders of the defined types were placed in this encounter.  Orders:  No orders of the defined types were placed in this encounter.  Follow-up plan:   No follow-ups on file.   I discussed the assessment and treatment plan with the patient. The patient was provided an  opportunity to ask questions and all were answered. The patient agreed with the plan and demonstrated an understanding of the instructions.  Patient advised to call back or seek an in-person evaluation if the symptoms or condition worsens.  Total duration of non-face-to-face encounter: 12 minutes.  Note by: Thad Rangerrystal Claire Dolores, NP Date: 04/21/2019; Time: 8:19 AM  Disclaimer:  * Given the special circumstances of the COVID-19 pandemic, the federal government has announced that the Office for Civil Rights (OCR) will exercise its enforcement discretion and will not impose penalties on physicians using telehealth in the event of noncompliance with regulatory requirements under the [employer] Insurance Portability and Accountability Act (HIPAA) in connection with the good faith provision of telehealth during the COVID-19 national public health emergency. (AMA)

## 2019-04-24 ENCOUNTER — Encounter: Payer: Self-pay | Admitting: Family Medicine

## 2019-04-24 ENCOUNTER — Other Ambulatory Visit: Payer: Self-pay

## 2019-04-24 ENCOUNTER — Ambulatory Visit (INDEPENDENT_AMBULATORY_CARE_PROVIDER_SITE_OTHER): Payer: Medicare Other | Admitting: Family Medicine

## 2019-04-24 VITALS — BP 127/80 | HR 62 | Temp 97.8°F | Resp 16 | Ht 68.0 in | Wt 172.0 lb

## 2019-04-24 DIAGNOSIS — J411 Mucopurulent chronic bronchitis: Secondary | ICD-10-CM | POA: Diagnosis not present

## 2019-04-24 DIAGNOSIS — F339 Major depressive disorder, recurrent, unspecified: Secondary | ICD-10-CM

## 2019-04-24 DIAGNOSIS — F5104 Psychophysiologic insomnia: Secondary | ICD-10-CM | POA: Diagnosis not present

## 2019-04-24 DIAGNOSIS — E559 Vitamin D deficiency, unspecified: Secondary | ICD-10-CM

## 2019-04-24 DIAGNOSIS — I10 Essential (primary) hypertension: Secondary | ICD-10-CM

## 2019-04-24 DIAGNOSIS — G894 Chronic pain syndrome: Secondary | ICD-10-CM

## 2019-04-24 DIAGNOSIS — K219 Gastro-esophageal reflux disease without esophagitis: Secondary | ICD-10-CM

## 2019-04-24 MED ORDER — PAROXETINE HCL 40 MG PO TABS: 40.0000 mg | ORAL_TABLET | ORAL | 1 refills | Status: DC

## 2019-04-24 MED ORDER — PANTOPRAZOLE SODIUM 40 MG PO TBEC: 40.0000 mg | DELAYED_RELEASE_TABLET | Freq: Every day | ORAL | 1 refills | Status: DC

## 2019-04-24 MED ORDER — LOSARTAN POTASSIUM 100 MG PO TABS: 100.0000 mg | ORAL_TABLET | Freq: Every day | ORAL | 1 refills | Status: DC

## 2019-04-24 MED ORDER — AMLODIPINE BESYLATE 5 MG PO TABS: 5.0000 mg | ORAL_TABLET | Freq: Every day | ORAL | 1 refills | Status: DC

## 2019-04-24 MED ORDER — VITAMIN D 50 MCG (2000 UT) PO CAPS: 1.0000 | ORAL_CAPSULE | Freq: Every day | ORAL | 1 refills | Status: AC

## 2019-04-24 NOTE — Progress Notes (Signed)
Name: Natalie Lloyd   MRN: [id number]    DOB: [birth year]   Date:04/24/2019       Progress Note  Subjective  Chief Complaint  Chief Complaint  Patient presents with  . Medication Refill  . COPD  . Hypertension    Denies any symptoms  . Pain    Sees Dr. Brooke Dare  for management  . Gastroesophageal Reflux    Takes soon as she wakes up and controls symptoms  . Depression    Doing better living with her daughter    I connected with  Natalie Lloyd on 04/24/19 by a video enabled telemedicine application and verified that I am speaking with the correct person using two identifiers.   I discussed the limitations of evaluation and management by telemedicine. The patient expressed understanding and agreed to proceed.  Patient is at home I am at work   HPI  COPD : diagnosed years ago, not on maintenance medication and does not want it at this time. She states very sick this past Spring, she had pneumonia Spring 2019 and was hospitalized in [place]. She states SOB and cough got worse after the pneumonia, but getting back to baseline. She has occasional cough at night, and also SOB with activity, normal spirometry on her last visit, still smokes a little daily. Explained importance of pneumonia vaccines but she continues to refuse it   HTN: she is taking medications, bp is at goal, no dizziness, chest pain or palpitation . She needs refills today   Osteoporosis: she has not been taking medication, I still don't have her old records from [place]. Explained that she needs to have a repeat bone density and she states she will wait for now because of COVID-19   Chronic pain: she had multiple surgeries, she has DDD spine and also right shoulder surgery and history of dislocation, she was seeing pain clinic at [place], but now under the care of Dr. Cherylann Ratel and NP Marena Chancy , taking hydrocodone TID, gabapentin and seems to be doing okay.   GERD: she is on pantoprazole and she states no heartburn or  regurgitation with medication, but unable to come off medication. She is aware of long term risk . Unchanged  Major Depression: she has a long history of depression, states she used to go weeks without living her house because of sadness. She started medication many years ago - about 15 years. Medication works well for her. She is happy living in [place] with her daughter Natalie Lloyd. Phq 9 negative   Insomnia: she takes Trazodone prn only , she states when she takes medications she sleeps for about 12 hours and feels very rested.    Patient Active Problem List   Diagnosis Date Noted  . Long term current use of opiate analgesic 01/27/2019  . Chronic upper extremity pain (R>L) 01/27/2019  . Elevated TSH 01/27/2019  . Elevated PTHrP level 01/27/2019  . Vitamin D deficiency 01/27/2019  . H/O cervical spine surgery 12/02/2018  . Disorder of tendon of right biceps 12/02/2018  . Pain in joint of right shoulder 12/02/2018  . 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 Chronic pain syndrome 12/02/2018  . Cervical spondylosis 12/02/2018  . COPD (chronic obstructive pulmonary disease) (HCC) 10/22/2018  . Hypertension 10/22/2018  . Sleep apnea 10/22/2018  . GERD (gastroesophageal reflux disease) 10/22/2018  . Osteoporosis 10/22/2018  . History of pneumonia 10/22/2018  . Major depression, recurrent, chronic (HCC) 10/22/2018  . Chronic insomnia 10/22/2018  . DDD (degenerative disc disease), lumbar  10/22/2018    Past Surgical History:  Procedure Laterality Date  . APPENDECTOMY    . CARPAL TUNNEL RELEASE    . CERVICAL SPINE SURGERY    . ROTATOR CUFF REPAIR      History reviewed. No pertinent family history.  Social History   Socioeconomic History  . Marital status: Widowed    Spouse name: Benne  . Number of children: 5  . Years of education: Not on file  . Highest education level: Bachelor's degree (e.g., BA, AB, BS)  Occupational History  . Occupation: Disabled  Social Needs   . Financial resource strain: Somewhat hard  . Food insecurity:    Worry: Never true    Inability: Never true  . Transportation needs:    Medical: No    Non-medical: No  Tobacco Use  . Smoking status: Current Some Day Smoker    Years: 50.00    Types: Cigarettes  . Smokeless tobacco: Never Used  . Tobacco comment: 3-4 cigarettes per day  Substance and Sexual Activity  . Alcohol use: Yes    Comment: occ wine cooler  . Drug use: Not Currently    Types: IV    Comment: 37.5 yrs ago  . Sexual activity: Not Currently    Partners: Male    Birth control/protection: None  Lifestyle  . Physical activity:    Days per week: 7 days    Minutes per session: 20 min  . Stress: Not at all  Relationships  . Social connections:    Talks on phone: More than three times a week    Gets together: Never    Attends religious service: 1 to 4 times per year    Active member of club or organization: No    Attends meetings of clubs or organizations: Never    Relationship status: Widowed  . Intimate partner violence:    Fear of current or ex partner: No    Emotionally abused: No    Physically abused: No    Forced sexual activity: No  Other Topics Concern  . Not on file  Social History Narrative   Patient has 5 grown kids, 16 or 17 grandkids & about 3 great-grandkids.      Widowed for 4 yrs (10/6) now after 58yrs      Patient has moved here from [place].     Current Outpatient Medications:  .  albuterol (PROVENTIL HFA;VENTOLIN HFA) 108 (90 Base) MCG/ACT inhaler, Inhale 2 puffs into the lungs every 6 (six) hours as needed for wheezing or shortness of breath., Disp: , Rfl:  .  amLODipine (NORVASC) 5 MG tablet, Take 1 tablet (5 mg total) by mouth daily., Disp: 90 tablet, Rfl: 1 .  gabapentin (NEURONTIN) 100 MG capsule, Take 100 mg by mouth 3 (three) times daily., Disp: , Rfl:  .  HYDROcodone-acetaminophen (NORCO) 10-325 MG tablet, Take 1 tablet by mouth every 8 (eight) hours as needed for  up to 30 days for severe pain., Disp: 90 tablet, Rfl: 0 .  losartan (COZAAR) 100 MG tablet, Take 1 tablet (100 mg total) by mouth daily., Disp: 90 tablet, Rfl: 1 .  pantoprazole (PROTONIX) 40 MG tablet, Take 1 tablet (40 mg total) by mouth daily., Disp: 90 tablet, Rfl: 1 .  PARoxetine (PAXIL) 40 MG tablet, Take 1 tablet (40 mg total) by mouth every morning., Disp: 90 tablet, Rfl: 1 .  traZODone (DESYREL) 50 MG tablet, Take 1 tablet (50 mg total) by mouth at bedtime as needed for sleep.,  Disp: 90 tablet, Rfl: 0 .  Vitamin D, Ergocalciferol, (DRISDOL) 1.25 MG (50000 UT) CAPS capsule, TAKE 1 CAPSULE BY MOUTH EVERY 7 DAYS, Disp: 12 capsule, Rfl: 0  No Known Allergies  I personally reviewed active problem list, medication list, allergies, family history, social history with the patient/caregiver today.   ROS  Ten systems reviewed and is negative except as mentioned in HPI   Objective  Vital signs obtained from home   Vitals:   04/24/19 1006  BP: 127/80  Pulse: 62  Resp: 16  Temp: 97.8 F (36.6 C)  TempSrc: Oral  SpO2: 99%  Weight: 172 lb (78 kg)  Height: 5\' 8"  (1.727 m)    Body mass index is 26.15 kg/m.  Physical Exam  Awake , alert and oriented   PHQ2/9: Depression screen [facility] 2/[facility] 04/24/2019 01/27/2019 01/23/2019 11/25/2018 10/22/2018  Decreased Interest 0 0 0 0 0  Down, Depressed, Hopeless 0 0 0 0 0  PHQ - 2 Score 0 0 0 0 0  Altered sleeping 1 0 1 - 0  Tired, decreased energy 0 0 1 - 1  Change in appetite 1 0 0 - 1  Feeling bad or failure about yourself  0 0 0 - 0  Trouble concentrating 2 0 0 - 1  Moving slowly or fidgety/restless 1 0 0 - 0  Suicidal thoughts 0 0 0 - 0  PHQ-9 Score 5 0 2 - 3  Difficult doing work/chores Not difficult at all - Not difficult at all - Not difficult at all    phq 9 is negative   Fall Risk: Fall Risk  04/24/2019 01/27/2019 01/23/2019 01/01/2019 11/25/2018  Falls in the past year? 0 0 0 0 0  Number falls in past yr: 0 0 - - -  Injury with  Fall? 0 0 - - -  Risk for fall due to : - - Impaired balance/gait - -    Functional Status Survey: Is the patient deaf or have difficulty hearing?: No Does the patient have difficulty seeing, even when wearing glasses/contacts?: No Does the patient have difficulty concentrating, remembering, or making decisions?: No Does the patient have difficulty walking or climbing stairs?: Yes Does the patient have difficulty dressing or bathing?: No Does the patient have difficulty doing errands alone such as visiting a doctor's office or shopping?: Yes    Assessment & Plan   1. Mucopurulent chronic bronchitis (HCC)  Not on medication reminded her importance of quitting smoking   2. Chronic insomnia  Doing well on trazodone prn   3. Major depression, recurrent, chronic (HCC)  - PARoxetine (PAXIL) 40 MG tablet; Take 1 tablet (40 mg total) by mouth every morning.  Dispense: 90 tablet; Refill: 1  4. Gastroesophageal reflux disease without esophagitis  - pantoprazole (PROTONIX) 40 MG tablet; Take 1 tablet (40 mg total) by mouth daily.  Dispense: 90 tablet; Refill: 1  5. Chronic pain syndrome  Under the care of pain clinic   6. Essential hypertension  - amLODipine (NORVASC) 5 MG tablet; Take 1 tablet (5 mg total) by mouth daily.  Dispense: 90 tablet; Refill: 1 - losartan (COZAAR) 100 MG tablet; Take 1 tablet (100 mg total) by mouth daily.  Dispense: 90 tablet; Refill: 1  7. Vitamin D deficiency  - Cholecalciferol (VITAMIN D) 50 MCG (2000 UT) CAPS; Take 1 capsule (2,000 Units total) by mouth daily.  Dispense: 100 capsule; Refill: 1   Time on the telephone was 25 minutes , non face to face encounter

## 2019-05-21 ENCOUNTER — Ambulatory Visit
Payer: Medicare Other | Attending: Student in an Organized Health Care Education/Training Program | Admitting: Student in an Organized Health Care Education/Training Program

## 2019-05-21 ENCOUNTER — Other Ambulatory Visit: Payer: Self-pay

## 2019-05-21 ENCOUNTER — Encounter: Payer: Self-pay | Admitting: Student in an Organized Health Care Education/Training Program

## 2019-05-21 DIAGNOSIS — M79602 Pain in left arm: Secondary | ICD-10-CM

## 2019-05-21 DIAGNOSIS — Z9889 Other specified postprocedural states: Secondary | ICD-10-CM

## 2019-05-21 DIAGNOSIS — Z79891 Long term (current) use of opiate analgesic: Secondary | ICD-10-CM

## 2019-05-21 DIAGNOSIS — M7918 Myalgia, other site: Secondary | ICD-10-CM | POA: Diagnosis not present

## 2019-05-21 DIAGNOSIS — Z96611 Presence of right artificial shoulder joint: Secondary | ICD-10-CM

## 2019-05-21 DIAGNOSIS — G8929 Other chronic pain: Secondary | ICD-10-CM

## 2019-05-21 DIAGNOSIS — M67921 Unspecified disorder of synovium and tendon, right upper arm: Secondary | ICD-10-CM

## 2019-05-21 DIAGNOSIS — M47812 Spondylosis without myelopathy or radiculopathy, cervical region: Secondary | ICD-10-CM

## 2019-05-21 DIAGNOSIS — G894 Chronic pain syndrome: Secondary | ICD-10-CM

## 2019-05-21 DIAGNOSIS — M79601 Pain in right arm: Secondary | ICD-10-CM

## 2019-05-21 MED ORDER — GABAPENTIN 100 MG PO CAPS: 100.0000 mg | ORAL_CAPSULE | Freq: Three times a day (TID) | ORAL | 5 refills | Status: DC

## 2019-05-21 MED ORDER — HYDROCODONE-ACETAMINOPHEN 10-325 MG PO TABS: 1.0000 | ORAL_TABLET | Freq: Three times a day (TID) | ORAL | 0 refills | Status: DC | PRN

## 2019-05-21 NOTE — Progress Notes (Signed)
Pain Management Virtual Encounter Note - Virtual Visit via Telephone Telehealth (real-time audio visits between healthcare provider and patient).   Patient's Phone No. & Preferred Pharmacy:  [phone] (home); [phone] (mobile); (Preferred) [phone] [email or web address]  [id number] [place] [facility]- [place] - [address] [facility] [address] [place] [address] Phone: [phone] Fax: [phone]  [id number] [place], [place] - [address] [place] [address] Phone: [phone] Fax: [phone]    Pre-screening note:  Our staff contacted Ms. Delford FieldWright and offered her an "in person", "face-to-face" appointment versus a telephone encounter. She indicated preferring the telephone encounter, at this time.   Reason for Virtual Visit: COVID-19*  Social distancing based on CDC and AMA recommendations.   I contacted Sabino Gasserarolyn Lobb on 05/21/2019 at 12:16 PM via telephone.      I clearly identified myself as Edward JollyBilal Baran Kuhrt, MD. I verified that I was speaking with the correct person using two identifiers (Name: Sabino GasserCarolyn Besecker, and date of birth: 04/11/1946).  Advanced Informed Consent I sought verbal advanced consent from Sabino Gasserarolyn Radebaugh for virtual visit interactions. I informed Ms. Delford FieldWright of possible security and privacy concerns, risks, and limitations associated with providing "not-in-person" medical evaluation and management services. I also informed Ms. Delford FieldWright of the availability of "in-person" appointments. Finally, I informed her that there would be a charge for the virtual visit and that she could be  personally, fully or partially, financially responsible for it. Ms. Delford FieldWright expressed understanding and agreed to proceed.   Historic Elements   Ms. Sabino GasserCarolyn Simic is a 73 y.o. year old, female patient evaluated today after her last encounter by our practice on 04/21/2019. Ms. Delford FieldWright  has a past medical history of Anxiety,  Arthritis, COPD (chronic obstructive pulmonary disease) (HCC), DDD (degenerative disc disease), lumbar, Depression, Generalized OA, GERD (gastroesophageal reflux disease), Hypertension, Osteoporosis, Sleep apnea, and Substance abuse (HCC). She also  has a past surgical history that includes Rotator cuff repair; Appendectomy; Cervical spine surgery; and Carpal tunnel release. Ms. Delford FieldWright has a current medication list which includes the following prescription(s): albuterol, amlodipine, vitamin d, gabapentin, hydrocodone-acetaminophen, hydrocodone-acetaminophen, losartan, pantoprazole, paroxetine, and trazodone. She  reports that she has been smoking cigarettes. She has smoked for the past 50.00 years. She has never used smokeless tobacco. She reports current alcohol use. She reports previous drug use. Drug: IV. Ms. Delford FieldWright has No Known Allergies.   HPI  Today, she is being contacted for medication management.  Pharmacotherapy Assessment   04/21/2019  2   04/21/2019  Hydrocodone-Acetamin 10-325 MG  90.00 30 Cr Kin   [id number]   [facility] ([id number])   0  30.00 MME  Medicaid   [facility]     Monitoring: Pharmacotherapy: No side-effects or adverse reactions reported. [place] PMP: PDMP reviewed during this encounter.       Compliance: No problems identified. Effectiveness: Clinically acceptable. Plan: Refer to "POC".  Pertinent Labs   SAFETY SCREENING Profile No results found for: SARSCOV2NAA, STAPHAUREUS, MRSAPCR, HCVAB, HIV, PREGTESTUR Renal Function Lab Results  Component Value Date   BUN 12 01/23/2019   CREATININE 0.94 (H) 01/23/2019   BCR 13 01/23/2019   GFRAA 70 01/23/2019   GFRNONAA 60 01/23/2019   Hepatic Function Lab Results  Component Value Date   AST 25 01/23/2019   ALT 15 01/23/2019   UDS Summary  Date Value Ref Range Status  11/25/2018 FINAL  Final    Comment:    ====================================================================  TOXAS[facility] COMP DRUG  ANALYSIS,UR ==================================================================== Test                             Result       Flag       Units Drug Present and Declared for Prescription Verification   Hydrocodone                    35           EXPECTED   ng/mg creat    Sources of hydrocodone include scheduled prescription    medications.   Gabapentin                     PRESENT      EXPECTED   Paroxetine                     PRESENT      EXPECTED   Trazodone                      PRESENT      EXPECTED   1,3 chlorophenyl piperazine    PRESENT      EXPECTED    1,3-chlorophenyl piperazine is an expected metabolite of    trazodone.   Acetaminophen                  PRESENT      EXPECTED Drug Present not Declared for Prescription Verification   Cyclobenzaprine                PRESENT      UNEXPECTED   Desmethylcyclobenzaprine       PRESENT      UNEXPECTED    Desmethylcyclobenzaprine is an expected metabolite of    cyclobenzaprine.   Naproxen                       PRESENT      UNEXPECTED   Diphenhydramine                PRESENT      UNEXPECTED ==================================================================== Test                      Result    Flag   Units      Ref Range   Creatinine              145              mg/dL     >=96>=20 ==================================================================== Declared Medications:  The flagging and interpretation on this report are based on the  following declared medications.  Unexpected results may arise from  inaccuracies in the declared medications.  **Note: The testing scope of this panel includes these medications:  Gabapentin  Hydrocodone (Hydrocodone-Acetaminophen)  Paroxetine  Trazodone  **Note: The testing scope of this panel does not include small to  moderate amounts of these reported medications:  Acetaminophen (Hydrocodone-Acetaminophen)  **Note: The testing scope of this panel does not include following  reported medications:   Albuterol  Amlodipine  Losartan  Pantoprazole ==================================================================== For clinical consultation, please call [phone]. ====================================================================    Note: Above Lab results reviewed.   Assessment  The primary encounter diagnosis was Myofascial pain syndrome. Diagnoses of Chronic pain syndrome, History of shoulder surgery, Cervical spondylosis, H/O cervical spine surgery, H/O total shoulder replacement, right, Long term current use of  opiate analgesic, Disorder of tendon of right biceps, and Chronic pain of both upper extremities were also pertinent to this visit.  Refill gabapentin and hydrocodone as below.  Patient states that when she was in [place] she would receive cervical and trapezius trigger point injections which were helpful.  We can repeat cervical/trapezius trigger point injections for cervical myofascial pain syndrome.  Future considerations also include diagnostic cervical facet medial branch nerve blocks for cervical facet joint syndrome.  Patient does have history of cervical spine fusion and history of 5 shoulder surgeries.   Plan of Care  I have changed Eber Jones Sulton's gabapentin. I am also having her start on HYDROcodone-acetaminophen. Additionally, I am having her maintain her albuterol, traZODone, Vitamin D, amLODipine, losartan, pantoprazole, PARoxetine, and HYDROcodone-acetaminophen.  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  Pharmacotherapy (Medications Ordered): Meds ordered this encounter  Medications  . HYDROcodone-acetaminophen (NORCO) 10-325 MG tablet    Sig: Take 1 tablet by mouth every 8 (eight) hours as needed for up to 30 days for  severe pain.    Dispense:  90 tablet    Refill:  0    Do not place this medication, or any other prescription from our practice, on "Automatic Refill". Patient may have prescription filled one day early if pharmacy is closed on scheduled refill date.  Marland Kitchen HYDROcodone-acetaminophen (NORCO) 10-325 MG tablet    Sig: Take 1 tablet by mouth every 8 (eight) hours as needed for up to 30 days for severe pain.    Dispense:  90 tablet    Refill:  0    Do not place this medication, or any other prescription from our practice, on "Automatic Refill". Patient may have prescription filled one day early if pharmacy is closed on scheduled refill date.  . gabapentin (NEURONTIN) 100 MG capsule    Sig: Take 1 capsule (100 mg total) by mouth 3 (three) times daily.    Dispense:  90 capsule    Refill:  5   Orders:  Orders Placed This Encounter  Procedures  . TRIGGER POINT INJECTION    Standing Status:   Future    Standing Expiration Date:   06/20/2019    Scheduling Instructions:     Area: neck/trapezius     Side: Bilateral     Sedation: NONE     Timeframe: ASAA    Order Specific Question:   Where will this procedure be performed?    Answer:   [facility]   Follow-up plan:   Return for ASAP TPI, 2 months medication management.    I discussed the assessment and treatment plan with the patient. The patient was provided an opportunity to ask questions and all were answered. The patient agreed with the plan and demonstrated an understanding of the instructions.  Patient advised to call back or seek an in-person evaluation if the symptoms or condition worsens.  Total duration of non-face-to-face encounter:25 minutes.  Note by: Edward Jolly, MD Date: 05/21/2019; Time: 12:16 PM  Note: This dictation was prepared with Dragon dictation. Any transcriptional errors that may result from this process are unintentional.  Disclaimer:  * Given the special circumstances of the COVID-19 pandemic, the federal  government has announced that the Office for Civil Rights (OCR) will exercise its enforcement discretion and will not impose penalties on physicians using telehealth in the  event of noncompliance with regulatory requirements under the [employer] and Accountability Act (HIPAA) in connection with the good faith provision of telehealth during the IXVEZ-50 national public health emergency. (AMA)

## 2019-06-12 ENCOUNTER — Other Ambulatory Visit: Payer: Self-pay | Admitting: Student in an Organized Health Care Education/Training Program

## 2019-06-12 DIAGNOSIS — G894 Chronic pain syndrome: Secondary | ICD-10-CM

## 2019-06-12 DIAGNOSIS — Z9889 Other specified postprocedural states: Secondary | ICD-10-CM

## 2019-07-21 ENCOUNTER — Other Ambulatory Visit: Payer: Self-pay

## 2019-07-21 ENCOUNTER — Ambulatory Visit
Payer: Medicare Other | Attending: Student in an Organized Health Care Education/Training Program | Admitting: Student in an Organized Health Care Education/Training Program

## 2019-07-21 ENCOUNTER — Encounter: Payer: Self-pay | Admitting: Student in an Organized Health Care Education/Training Program

## 2019-07-21 DIAGNOSIS — M67921 Unspecified disorder of synovium and tendon, right upper arm: Secondary | ICD-10-CM | POA: Diagnosis not present

## 2019-07-21 DIAGNOSIS — Z79891 Long term (current) use of opiate analgesic: Secondary | ICD-10-CM

## 2019-07-21 DIAGNOSIS — G8929 Other chronic pain: Secondary | ICD-10-CM

## 2019-07-21 DIAGNOSIS — Z9889 Other specified postprocedural states: Secondary | ICD-10-CM | POA: Diagnosis not present

## 2019-07-21 DIAGNOSIS — M79602 Pain in left arm: Secondary | ICD-10-CM | POA: Diagnosis not present

## 2019-07-21 DIAGNOSIS — G894 Chronic pain syndrome: Secondary | ICD-10-CM

## 2019-07-21 DIAGNOSIS — M7918 Myalgia, other site: Secondary | ICD-10-CM | POA: Diagnosis not present

## 2019-07-21 DIAGNOSIS — Z96611 Presence of right artificial shoulder joint: Secondary | ICD-10-CM

## 2019-07-21 DIAGNOSIS — M47812 Spondylosis without myelopathy or radiculopathy, cervical region: Secondary | ICD-10-CM | POA: Diagnosis not present

## 2019-07-21 DIAGNOSIS — M79601 Pain in right arm: Secondary | ICD-10-CM | POA: Diagnosis not present

## 2019-07-21 MED ORDER — HYDROCODONE-ACETAMINOPHEN 10-325 MG PO TABS: 1.0000 | ORAL_TABLET | Freq: Three times a day (TID) | ORAL | 0 refills | Status: DC | PRN

## 2019-07-21 NOTE — Progress Notes (Signed)
Pain Management Virtual Encounter Note - Virtual Visit via Telephone Telehealth (real-time audio visits between healthcare provider and patient).   Patient's Phone No. & Preferred Pharmacy:  [phone] (home); [phone] (mobile); (Preferred) [phone] [email or web address]  [id number] [place] - [address] [facility] [address] [place] [address] Phone: [phone] Fax: [phone]  [id number] [place], [place] - [address] [place] [address] Phone: [phone] Fax: [phone]    Pre-screening note:  Our staff contacted Natalie Lloyd and offered her an "in person", "face-to-face" appointment versus a telephone encounter. She indicated preferring the telephone encounter, at this time.   Reason for Virtual Visit: COVID-19*  Social distancing based on CDC and AMA recommendations.   I contacted Natalie Lloyd on 07/21/2019 via telephone.      I clearly identified myself as Edward JollyBilal Tynleigh Birt, MD. I verified that I was speaking with the correct person using two identifiers (Name: Natalie Lloyd, and date of birth: 11/04/1946).  Advanced Informed Consent I sought verbal advanced consent from Natalie Lloyd for virtual visit interactions. I informed Natalie Lloyd of possible security and privacy concerns, risks, and limitations associated with providing "not-in-person" medical evaluation and management services. I also informed Natalie Lloyd of the availability of "in-person" appointments. Finally, I informed her that there would be a charge for the virtual visit and that she could be  personally, fully or partially, financially responsible for it. Natalie Lloyd expressed understanding and agreed to proceed.   Historic Elements   Natalie Lloyd is a 73 y.o. year old, female patient evaluated today after her last encounter by our practice on 06/12/2019. Natalie Lloyd  has a past medical history of Anxiety, Arthritis, COPD  (chronic obstructive pulmonary disease) (HCC), DDD (degenerative disc disease), lumbar, Depression, Generalized OA, GERD (gastroesophageal reflux disease), Hypertension, Osteoporosis, Sleep apnea, and Substance abuse (HCC). She also  has a past surgical history that includes Rotator cuff repair; Appendectomy; Cervical spine surgery; and Carpal tunnel release. Natalie Lloyd has a current medication list which includes the following prescription(s): albuterol, amlodipine, vitamin d, gabapentin, hydrocodone-acetaminophen, hydrocodone-acetaminophen, losartan, pantoprazole, paroxetine, and trazodone. She  reports that she has been smoking cigarettes. She has smoked for the past 50.00 years. She has never used smokeless tobacco. She reports current alcohol use. She reports previous drug use. Drug: IV. Natalie Lloyd has No Known Allergies.   HPI  Today, she is being contacted for medication management.   No change in medical history since last visit.  Patient's pain is at baseline.  Patient continues multimodal pain regimen as prescribed.  States that it provides pain relief and improvement in functional status.   Pharmacotherapy Assessment   06/20/2019  2   05/21/2019  Hydrocodone-Acetamin 10-325 MG  90.00 30 Bi Lat   [id number]   [facility] ([id number])   0  30.00 MME  Private Pay   [place]     Monitoring: Pharmacotherapy: No side-effects or adverse reactions reported. [place] PMP: PDMP reviewed during this encounter.       Compliance: No problems identified. Effectiveness: Clinically acceptable. Plan: Refer to "POC".  UDS:  Summary  Date Value Ref Range Status  11/25/2018 FINAL  Final    Comment:    ==================================================================== TOXAS[facility]RE COMP DRUG ANALYSIS,UR ==================================================================== Test  Result       Flag       Units Drug Present and Declared for Prescription Verification   Hydrocodone                    35            EXPECTED   ng/mg creat    Sources of hydrocodone include scheduled prescription    medications.   Gabapentin                     PRESENT      EXPECTED   Paroxetine                     PRESENT      EXPECTED   Trazodone                      PRESENT      EXPECTED   1,3 chlorophenyl piperazine    PRESENT      EXPECTED    1,3-chlorophenyl piperazine is an expected metabolite of    trazodone.   Acetaminophen                  PRESENT      EXPECTED Drug Present not Declared for Prescription Verification   Cyclobenzaprine                PRESENT      UNEXPECTED   Desmethylcyclobenzaprine       PRESENT      UNEXPECTED    Desmethylcyclobenzaprine is an expected metabolite of    cyclobenzaprine.   Naproxen                       PRESENT      UNEXPECTED   Diphenhydramine                PRESENT      UNEXPECTED ==================================================================== Test                      Result    Flag   Units      Ref Range   Creatinine              145              mg/dL      >=16>=20 ==================================================================== Declared Medications:  The flagging and interpretation on this report are based on the  following declared medications.  Unexpected results may arise from  inaccuracies in the declared medications.  **Note: The testing scope of this panel includes these medications:  Gabapentin  Hydrocodone (Hydrocodone-Acetaminophen)  Paroxetine  Trazodone  **Note: The testing scope of this panel does not include small to  moderate amounts of these reported medications:  Acetaminophen (Hydrocodone-Acetaminophen)  **Note: The testing scope of this panel does not include following  reported medications:  Albuterol  Amlodipine  Losartan  Pantoprazole ==================================================================== For clinical consultation, please call [phone]  [phone]. ====================================================================    Laboratory Chemistry Profile (12 mo)  Renal: 01/23/2019: BUN 12; BUN/Creatinine Ratio 13; Creat 0.94; GFR, Est African American 70; GFR, Est Non African American 60  Hepatic: 01/23/2019: ALT 15; AST 25 Other: 01/23/2019: Vit D, 25-Hydroxy 10 Note: Above Lab results reviewed.  Imaging  Last 90 days:  No results found. Last Hospital Admission:  Patient was never admitted. Assessment  The primary encounter diagnosis was Chronic pain syndrome. Diagnoses of Chronic pain  of both upper extremities, History of shoulder surgery, Myofascial pain syndrome, Cervical spondylosis, H/O cervical spine surgery, H/O total shoulder replacement, right, Long term current use of opiate analgesic, and Disorder of tendon of right biceps were also pertinent to this visit.  Plan of Care  I have changed Natalie Lloyd's HYDROcodone-acetaminophen. I am also having her start on HYDROcodone-acetaminophen. Additionally, I am having her maintain her albuterol, traZODone, Vitamin D, amLODipine, losartan, pantoprazole, PARoxetine, and gabapentin.  Patient's pain at baseline.  Continues gabapentin and hydrocodone.  Refill as below.  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  Pharmacotherapy (Medications Ordered): Meds ordered this encounter  Medications  . HYDROcodone-acetaminophen (NORCO) 10-325 MG tablet    Sig: Take 1 tablet by mouth every 8 (eight) hours as needed for severe pain.    Dispense:  90 tablet    Refill:  0    Do not place this medication, or any other prescription from our practice, on "Automatic Refill". Patient may have prescription filled one day early if pharmacy is closed on scheduled  refill date.  Marland Kitchen HYDROcodone-acetaminophen (NORCO) 10-325 MG tablet    Sig: Take 1 tablet by mouth every 8 (eight) hours as needed for severe pain.    Dispense:  90 tablet    Refill:  0    Do not place this medication, or any other prescription from our practice, on "Automatic Refill". Patient may have prescription filled one day early if pharmacy is closed on scheduled refill date.   Orders:  No orders of the defined types were placed in this encounter.  Follow-up plan:   Return in about 8 weeks (around 09/15/2019) for Medication Management.     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   Recent Visits Date Type Provider Dept  05/21/19 Office Visit Gillis Santa, MD [facility]  Showing recent visits within past 90 days and meeting all other requirements   Today's Visits Date Type Provider Dept  07/21/19 Office Visit Gillis Santa, MD [facility]  Showing today's visits and meeting all other requirements   Future Appointments No visits were found meeting these conditions.  Showing future appointments within next 90 days and meeting all other requirements   I discussed the assessment and treatment plan with the patient. The patient was provided an opportunity to ask questions and all were answered. The patient agreed with the plan and demonstrated an understanding of the instructions.  Patient advised to call back or seek an in-person evaluation if the symptoms or condition worsens.  Total duration of non-face-to-face encounter: 20 minutes.  Note by: Gillis Santa, MD Date: 07/21/2019; Time: 8:55 AM  Note: This dictation was prepared with Dragon dictation. Any transcriptional errors that may result from this process are  unintentional.  Disclaimer:  * Given the special circumstances of the COVID-19 pandemic, the federal government has announced that the Office for Civil Rights (OCR) will exercise its enforcement discretion and will not impose penalties on physicians using telehealth in the event of noncompliance with regulatory requirements under the [place] (HIPAA) in connection with the good faith provision of telehealth during the I[place]-89 national public health emergency. ([place])

## 2019-07-24 ENCOUNTER — Other Ambulatory Visit: Payer: Self-pay | Admitting: Family Medicine

## 2019-07-24 ENCOUNTER — Ambulatory Visit (INDEPENDENT_AMBULATORY_CARE_PROVIDER_SITE_OTHER): Payer: Medicare Other

## 2019-07-24 VITALS — BP 142/58 | Ht 68.0 in | Wt 175.0 lb

## 2019-07-24 DIAGNOSIS — Z780 Asymptomatic menopausal state: Secondary | ICD-10-CM | POA: Diagnosis not present

## 2019-07-24 DIAGNOSIS — Z0000 Encounter for general adult medical examination without abnormal findings: Secondary | ICD-10-CM

## 2019-07-24 DIAGNOSIS — J411 Mucopurulent chronic bronchitis: Secondary | ICD-10-CM

## 2019-07-24 NOTE — Patient Instructions (Addendum)
Natalie Lloyd , Thank you for taking time to come for your Medicare Wellness Visit. I appreciate your ongoing commitment to your health goals. Please review the following plan we discussed and let me know if I can assist you in the future.   Screening recommendations/referrals: Colonoscopy: completed Mammogram: Please call [phone] to schedule your mammogram and bone density screening.  Bone Density: ordered Recommended yearly ophthalmology/optometry visit for glaucoma screening and checkup Recommended yearly dental visit for hygiene and checkup  Vaccinations: Influenza vaccine: postponed Pneumococcal vaccine: postponed Tdap vaccine: postponed Shingles vaccine: Shingrix discussed. Please contact your pharmacy for coverage information.   Advanced directives: Advance directive discussed with you today. I have provided a copy for you to complete at home and have notarized. Once this is complete please bring a copy in to our office so we can scan it into your chart.  Conditions/risks identified: If you wish to quit smoking, help is available. For free tobacco cessation program offerings call the [facility] at [phone] or Live Well Line at [phone]. You may also visit [email or web address] or email [email or web address] for more information on other programs.   Next appointment: Please follow up in one year for your Medicare Annual Wellness visit.     Preventive Care 7 Years and Older, Female Preventive care refers to lifestyle choices and visits with your health care provider that can promote health and wellness. What does preventive care include?  A yearly physical exam. This is also called an annual well check.  Dental exams once or twice a year.  Routine eye exams. Ask your health care provider how often you should have your eyes checked.  Personal lifestyle choices, including:  Daily care of your teeth and gums.  Regular physical activity.   Eating a healthy diet.  Avoiding tobacco and drug use.  Limiting alcohol use.  Practicing safe sex.  Taking low-dose aspirin every day.  Taking vitamin and mineral supplements as recommended by your health care provider. What happens during an annual well check? The services and screenings done by your health care provider during your annual well check will depend on your age, overall health, lifestyle risk factors, and family history of disease. Counseling  Your health care provider may ask you questions about your:  Alcohol use.  Tobacco use.  Drug use.  Emotional well-being.  Home and relationship well-being.  Sexual activity.  Eating habits.  History of falls.  Memory and ability to understand (cognition).  Work and work Statistician.  Reproductive health. Screening  You may have the following tests or measurements:  Height, weight, and BMI.  Blood pressure.  Lipid and cholesterol levels. These may be checked every 5 years, or more frequently if you are over 59 years old.  Skin check.  Lung cancer screening. You may have this screening every year starting at age 81 if you have a 30-pack-year history of smoking and currently smoke or have quit within the past 15 years.  Fecal occult blood test (FOBT) of the stool. You may have this test every year starting at age 54.  Flexible sigmoidoscopy or colonoscopy. You may have a sigmoidoscopy every 5 years or a colonoscopy every 10 years starting at age 27.  Hepatitis C blood test.  Hepatitis B blood test.  Sexually transmitted disease (STD) testing.  Diabetes screening. This is done by checking your blood sugar (glucose) after you have not eaten for a while (fasting). You may have this done every 1-3 years.  Bone density  scan. This is done to screen for osteoporosis. You may have this done starting at age 865.  Mammogram. This may be done every 1-2 years. Talk to your health care provider about how often you  should have regular mammograms. Talk with your health care provider about your test results, treatment options, and if necessary, the need for more tests. Vaccines  Your health care provider may recommend certain vaccines, such as:  Influenza vaccine. This is recommended every year.  Tetanus, diphtheria, and acellular pertussis (Tdap, Td) vaccine. You may need a Td booster every 10 years.  Zoster vaccine. You may need this after age 74.  Pneumococcal 13-valent conjugate (PCV13) vaccine. One dose is recommended after age 74.  Pneumococcal polysaccharide (PPSV23) vaccine. One dose is recommended after age 10465. Talk to your health care provider about which screenings and vaccines you need and how often you need them. This information is not intended to replace advice given to you by your health care provider. Make sure you discuss any questions you have with your health care provider. Document Released: 12/30/2015 Document Revised: 08/22/2016 Document Reviewed: 10/04/2015 Elsevier Interactive Patient Education  2017 [employer]  Fall Prevention in the Home Falls can cause injuries. They can happen to people of all ages. There are many things you can do to make your home safe and to help prevent falls. What can I do on the outside of my home?  Regularly fix the edges of walkways and driveways and fix any cracks.  Remove anything that might make you trip as you walk through a door, such as a raised step or threshold.  Trim any bushes or trees on the path to your home.  Use bright outdoor lighting.  Clear any walking paths of anything that might make someone trip, such as rocks or tools.  Regularly check to see if handrails are loose or broken. Make sure that both sides of any steps have handrails.  Any raised decks and porches should have guardrails on the edges.  Have any leaves, snow, or ice cleared regularly.  Use sand or salt on walking paths during winter.  Clean up any  spills in your garage right away. This includes oil or grease spills. What can I do in the bathroom?  Use night lights.  Install grab bars by the toilet and in the tub and shower. Do not use towel bars as grab bars.  Use non-skid mats or decals in the tub or shower.  If you need to sit down in the shower, use a plastic, non-slip stool.  Keep the floor dry. Clean up any water that spills on the floor as soon as it happens.  Remove soap buildup in the tub or shower regularly.  Attach bath mats securely with double-sided non-slip rug tape.  Do not have throw rugs and other things on the floor that can make you trip. What can I do in the bedroom?  Use night lights.  Make sure that you have a light by your bed that is easy to reach.  Do not use any sheets or blankets that are too big for your bed. They should not hang down onto the floor.  Have a firm chair that has side arms. You can use this for support while you get dressed.  Do not have throw rugs and other things on the floor that can make you trip. What can I do in the kitchen?  Clean up any spills right away.  Avoid walking on wet  floors.  Keep items that you use a lot in easy-to-reach places.  If you need to reach something above you, use a strong step stool that has a grab bar.  Keep electrical cords out of the way.  Do not use floor polish or wax that makes floors slippery. If you must use wax, use non-skid floor wax.  Do not have throw rugs and other things on the floor that can make you trip. What can I do with my stairs?  Do not leave any items on the stairs.  Make sure that there are handrails on both sides of the stairs and use them. Fix handrails that are broken or loose. Make sure that handrails are as long as the stairways.  Check any carpeting to make sure that it is firmly attached to the stairs. Fix any carpet that is loose or worn.  Avoid having throw rugs at the top or bottom of the stairs. If you  do have throw rugs, attach them to the floor with carpet tape.  Make sure that you have a light switch at the top of the stairs and the bottom of the stairs. If you do not have them, ask someone to add them for you. What else can I do to help prevent falls?  Wear shoes that:  Do not have high heels.  Have rubber bottoms.  Are comfortable and fit you well.  Are closed at the toe. Do not wear sandals.  If you use a stepladder:  Make sure that it is fully opened. Do not climb a closed stepladder.  Make sure that both sides of the stepladder are locked into place.  Ask someone to hold it for you, if possible.  Clearly mark and make sure that you can see:  Any grab bars or handrails.  First and last steps.  Where the edge of each step is.  Use tools that help you move around (mobility aids) if they are needed. These include:  Canes.  Walkers.  Scooters.  Crutches.  Turn on the lights when you go into a dark area. Replace any light bulbs as soon as they burn out.  Set up your furniture so you have a clear path. Avoid moving your furniture around.  If any of your floors are uneven, fix them.  If there are any pets around you, be aware of where they are.  Review your medicines with your doctor. Some medicines can make you feel dizzy. This can increase your chance of falling. Ask your doctor what other things that you can do to help prevent falls. This information is not intended to replace advice given to you by your health care provider. Make sure you discuss any questions you have with your health care provider. Document Released: 09/29/2009 Document Revised: 05/10/2016 Document Reviewed: 01/07/2015 Elsevier Interactive Patient Education  2017 [employer]

## 2019-07-24 NOTE — Progress Notes (Signed)
Subjective:   Natalie GasserCarolyn Lloyd is a 73 y.o. female who presents for an Initial Medicare Annual Wellness Visit.  Virtual Visit via Telephone Note  I connected with Natalie Gasserarolyn Behanna on 07/24/19 at 10:40 AM EDT by telephone and verified that I am speaking with the correct person using two identifiers.  Medicare Annual Wellness visit completed telephonically due to Covid-19 pandemic.   Location: Patient: home Provider: office   I discussed the limitations, risks, security and privacy concerns of performing an evaluation and management service by telephone and the availability of in person appointments. The patient expressed understanding and agreed to proceed.  Some vital signs may be absent or patient reported.   Reather LittlerKasey Dalayza Zambrana, LPN   Review of Systems    Cardiac Risk Factors include: advanced age (>5455men, 29>65 women);hypertension;smoking/ tobacco exposure     Objective:    Today's Vitals   07/24/19 1044 07/24/19 1046  BP: (!) 142/58   SpO2: 98%   Weight: 175 lb (79.4 kg)   Height: 5\' 8"  (1.727 m)   PainSc:  6    Body mass index is 26.61 kg/m.  Advanced Directives 07/24/2019 11/25/2018  Does Patient Have a Medical Advance Directive? No No  Would patient like information on creating a medical advance directive? Yes (MAU/Ambulatory/Procedural Areas - Information given) No - Patient declined    Current Medications (verified) Outpatient Encounter Medications as of 07/24/2019  Medication Sig   albuterol (PROVENTIL HFA;VENTOLIN HFA) 108 (90 Base) MCG/ACT inhaler Inhale 2 puffs into the lungs every 6 (six) hours as needed for wheezing or shortness of breath.   amLODipine (NORVASC) 5 MG tablet Take 1 tablet (5 mg total) by mouth daily.   calcium-vitamin D (OSCAL WITH D) 500-200 MG-UNIT tablet Take 1 tablet by mouth.   gabapentin (NEURONTIN) 100 MG capsule Take 1 capsule (100 mg total) by mouth 3 (three) times daily.   HYDROcodone-acetaminophen (NORCO) 10-325 MG tablet Take 1 tablet  by mouth every 8 (eight) hours as needed for severe pain.   losartan (COZAAR) 100 MG tablet Take 1 tablet (100 mg total) by mouth daily.   pantoprazole (PROTONIX) 40 MG tablet Take 1 tablet (40 mg total) by mouth daily.   PARoxetine (PAXIL) 40 MG tablet Take 1 tablet (40 mg total) by mouth every morning.   Cholecalciferol (VITAMIN D) 50 MCG (2000 UT) CAPS Take 1 capsule (2,000 Units total) by mouth daily. (Patient not taking: Reported on 07/24/2019)   [START ON 08/20/2019] HYDROcodone-acetaminophen (NORCO) 10-325 MG tablet Take 1 tablet by mouth every 8 (eight) hours as needed for severe pain. (Patient not taking: Reported on 07/24/2019)   traZODone (DESYREL) 50 MG tablet Take 1 tablet (50 mg total) by mouth at bedtime as needed for sleep. (Patient not taking: Reported on 07/24/2019)   No facility-administered encounter medications on file as of 07/24/2019.     Allergies (verified) Patient has no known allergies.   History: Past Medical History:  Diagnosis Date   Anxiety    Arthritis    COPD (chronic obstructive pulmonary disease) (HCC)    DDD (degenerative disc disease), lumbar    Depression    Generalized OA    GERD (gastroesophageal reflux disease)    Hypertension    Osteoporosis    Sleep apnea    Substance abuse (HCC)    Past Surgical History:  Procedure Laterality Date   APPENDECTOMY     CARPAL TUNNEL RELEASE     CERVICAL SPINE SURGERY     ROTATOR CUFF REPAIR  History reviewed. No pertinent family history. Social History   Socioeconomic History   Marital status: Widowed    Spouse name: Natalie Lloyd   Number of children: 5   Years of education: Not on file   Highest education level: Bachelor's degree (e.g., BA, AB, BS)  Occupational History   Occupation: Disabled  Ecologistocial Needs   Financial resource strain: Not very hard   Food insecurity    Worry: Never true    Inability: Never true   Transportation needs    Medical: No    Non-medical: No    Tobacco Use   Smoking status: Current Some Day Smoker    Packs/day: 0.25    Years: 50.00    Pack years: 12.50    Types: Cigarettes   Smokeless tobacco: Never Used   Tobacco comment: 3-4 cigarettes per day  Substance and Sexual Activity   Alcohol use: Yes    Comment: occ wine cooler   Drug use: Not Currently    Types: IV    Comment: 37.5 yrs ago   Sexual activity: Not Currently    Partners: Male    Birth control/protection: None  Lifestyle   Physical activity    Days per week: 7 days    Minutes per session: 20 min   Stress: Not at all  Relationships   Social connections    Talks on phone: More than three times a week    Gets together: Never    Attends religious service: 1 to 4 times per year    Active member of club or organization: No    Attends meetings of clubs or organizations: Never    Relationship status: Widowed  Other Topics Concern   Not on file  Social History Narrative   Patient has 5 grown kids, 16 or 17 grandkids & about 3 great-grandkids.      Widowed for 4 yrs (10/6) now after [age]yrs      [place] has moved here from [place].    Tobacco Counseling Ready to quit: Not Answered Counseling given: Not Answered Comment: 3-4 cigarettes per day   Clinical Intake:  Pre-visit preparation completed: Yes  Pain : 0-10 Pain Score: 6  Pain Type: Chronic pain Pain Location: Shoulder Pain Orientation: Right Pain Descriptors / Indicators: Aching Pain Onset: More than a month ago Pain Frequency: Constant     BMI - recorded: 26.61 Nutritional Status: BMI 25 -29 Overweight Nutritional Risks: None Diabetes: No  How often do you need to have someone help you when you read instructions, pamphlets, or other written materials from your doctor or pharmacy?: 1 - Never  Interpreter Needed?: No  Information entered by :: Reather LittlerKasey Marquiz Sotelo LPN   Activities of Daily Living In your present state of health, do you have any difficulty performing the  following activities: 07/24/2019 04/24/2019  Hearing? N N  Comment declines hearing aids -  Vision? Y N  Comment needs eye exam -  Difficulty concentrating or making decisions? N N  Walking or climbing stairs? N Y  Comment - -  Dressing or bathing? N N  Doing errands, shopping? Y Y  Comment - -  Preparing Food and eating ? Y -  Comment pt does not cook -  Using the Toilet? N -  In the past six months, have you accidently leaked urine? N -  Do you have problems with loss of bowel control? N -  Managing your Medications? Y -  Comment has help from her daughter -  Managing  your Finances? N -  Housekeeping or managing your Housekeeping? N -     Immunizations and Health Maintenance There is no immunization history for the selected administration types on file for this patient. Health Maintenance Due  Topic Date Due   MAMMOGRAM  08/21/1995   COLONOSCOPY  08/21/1995   DEXA SCAN  08/20/2010   INFLUENZA VACCINE  07/18/2019    Patient Care Team: Steele Sizer, MD as PCP - General (Family Medicine)  Indicate any recent Medical Services you may have received from other than [facility] providers in the past year (date may be approximate).     Assessment:   This is a routine wellness examination for [place].  Hearing/Vision screen  Hearing Screening   125Hz  250Hz  500Hz  1000Hz  2000Hz  3000Hz  4000Hz  6000Hz  8000Hz   Right ear:           Left ear:           Comments: Pt denies hearing difficulty  Vision Screening Comments: Pt past due for eye exam, new to the area and plans to contact insurance for in network provider  Dietary issues and exercise activities discussed: Current Exercise Habits: The patient does not participate in regular exercise at present, Exercise limited by: orthopedic condition(s)  Goals     Patient Stated     Patient moved here from [place] and would like to become more social and meet new people once pandemic has passed       Depression Screen PHQ 2/9  Scores 07/24/2019 04/24/2019 01/27/2019 01/23/2019 11/25/2018 10/22/2018  PHQ - 2 Score 0 0 0 0 0 0  PHQ- 9 Score 3 5 0 2 - 3  Exception Documentation - - Medical reason - - -    Fall Risk Fall Risk  07/24/2019 04/24/2019 01/27/2019 01/23/2019 01/01/2019  Falls in the past year? 1 0 0 0 0  Number falls in past yr: 1 0 0 - -  Injury with Fall? 0 0 0 - -  Risk for fall due to : Impaired balance/gait - - Impaired balance/gait -  Follow up Falls prevention discussed - - - -   FALL RISK PREVENTION PERTAINING TO THE HOME:  Any stairs in or around the home? Yes  If so, do they handrails? No   Home free of loose throw rugs in walkways, pet beds, electrical cords, etc? Yes  Adequate lighting in your home to reduce risk of falls? Yes   ASSISTIVE DEVICES UTILIZED TO PREVENT FALLS:  Life alert? No  Use of a cane, walker or w/c? No  Grab bars in the bathroom? Yes  Shower chair or bench in shower? Yes  Elevated toilet seat or a handicapped toilet? No   DME ORDERS:  DME order needed?  No   TIMED UP AND GO:  Was the test performed? No . Telephonic visit.   Education: Fall risk prevention has been discussed.  Intervention(s) required? No   Cognitive Function:     6CIT Screen 07/24/2019  What Year? 0 points  What month? 0 points  What time? 0 points  Count back from 20 0 points  Months in reverse 0 points  Repeat phrase 0 points  Total Score 0    Screening Tests Health Maintenance  Topic Date Due   MAMMOGRAM  08/21/1995   COLONOSCOPY  08/21/1995   DEXA SCAN  08/20/2010   INFLUENZA VACCINE  07/18/2019   TETANUS/TDAP  04/23/2020 (Originally 08/20/1965)   PNA vac Low Risk Adult (1 of 2 - PCV13) 04/23/2020 (Originally 08/20/2010)  Hepatitis C Screening  Completed    Qualifies for Shingles Vaccine? Yes  . Due for Shingrix. Education has been provided regarding the importance of this vaccine. Pt has been advised to call insurance company to determine out of pocket expense. Advised may also  receive vaccine at local pharmacy or Health Dept. Verbalized acceptance and understanding.  Tdap: Although this vaccine is not a covered service during a Wellness Exam, does the patient still wish to receive this vaccine today?  No .  Education has been provided regarding the importance of this vaccine. Advised may receive this vaccine at local pharmacy or Health Dept. Aware to provide a copy of the vaccination record if obtained from local pharmacy or Health Dept. Verbalized acceptance and understanding.  Flu Vaccine: Due for Flu vaccine. Does the patient want to receive this vaccine today?  No . Education has been provided regarding the importance of this vaccine but still declined. Advised may receive this vaccine at local pharmacy or Health Dept. Aware to provide a copy of the vaccination record if obtained from local pharmacy or Health Dept. Verbalized acceptance and understanding.  Pneumococcal Vaccine: Due for Pneumococcal vaccine. Does the patient want to receive this vaccine today?  No . Education has been provided regarding the importance of this vaccine but still declined. Advised may receive this vaccine at local pharmacy or Health Dept. Aware to provide a copy of the vaccination record if obtained from local pharmacy or Health Dept. Verbalized acceptance and understanding.   Cancer Screenings:  Colorectal Screening: Completed per patient in [place]. Repeat every 10 years;   Mammogram: Completed per patient in [place]. Repeat every year; Ordered today. Pt provided with contact information and advised to call to schedule appt.   Bone Density: Completed per patient. Results reflect unavailable. Repeat every 2 years. Ordered today. Pt provided with contact information and advised to call to schedule appt.   Lung Cancer Screening: (Low Dose CT Chest recommended if Age 39-80 years, 30 pack-year currently smoking OR have quit w/in 15years.) does not qualify.   Additional  Screening:  Hepatitis C Screening: does not qualify; Completed 01/23/19  Vision Screening: Recommended annual ophthalmology exams for early detection of glaucoma and other disorders of the eye. Is the patient up to date with their annual eye exam?  No  Who is the provider or what is the name of the office in which the pt attends annual eye exams? Not established If pt is not established with a provider, would they like to be referred to a provider to establish care? No .   Dental Screening: Recommended annual dental exams for proper oral hygiene  Community Resource Referral:  CRR required this visit?  No      Plan:    I have personally reviewed and addressed the Medicare Annual Wellness questionnaire and have noted the following in the patients chart:  A. Medical and social history B. Use of alcohol, tobacco or illicit drugs  C. Current medications and supplements D. Functional ability and status E.  Nutritional status F.  Physical activity G. Advance directives H. List of other physicians I.  Hospitalizations, surgeries, and ER visits in previous 12 months J.  Vitals K. Screenings such as hearing and vision if needed, cognitive and depression L. Referrals and appointments   In addition, I have reviewed and discussed with patient certain preventive protocols, quality metrics, and best practice recommendations. A written personalized care plan for preventive services as well as general preventive health recommendations were  provided to patient.   Signed,  Reather LittlerKasey Voyd Groft, LPN Nurse Health Advisor   Nurse Notes: advised patient we do not have records

## 2019-07-24 NOTE — Telephone Encounter (Signed)
Medication: albuterol (PROVENTIL HFA;VENTOLIN HFA) 108 (90 Base) MCG/ACT inhaler   Patient is requesting a refill.    Pharmacy:  [facility] [place], [place] [facility] [address] [phone] (Phone) [phone] (Fax)

## 2019-07-26 MED ORDER — ALBUTEROL SULFATE HFA 108 (90 BASE) MCG/ACT IN AERS: 2.0000 | INHALATION_SPRAY | Freq: Four times a day (QID) | RESPIRATORY_TRACT | 0 refills | Status: DC | PRN

## 2019-07-27 NOTE — Telephone Encounter (Signed)
lvm to schedule appt in September. Also lm informing that prescription has been sent to pharmacy

## 2019-08-18 ENCOUNTER — Telehealth: Payer: Self-pay | Admitting: Family Medicine

## 2019-08-18 DIAGNOSIS — J411 Mucopurulent chronic bronchitis: Secondary | ICD-10-CM

## 2019-08-18 NOTE — Telephone Encounter (Signed)
Requested medication (s) are due for refill today: yes  Requested medication (s) are on the active medication list: yes  Last refill: 07/27/19 Future visit scheduled: no  Notes to clinic:  One inhaler should last at least one month. If the patient is requesting refills earlier   Requested Prescriptions  Pending Prescriptions Disp Refills   VENTOLIN HFA 108 (90 Base) MCG/ACT inhaler [Pharmacy Med Name: VENTOLIN HFA INH W/DOS CTR [id number]] 18 g 0    Sig: INHALE 2 PUFFS INTO THE LUNGS EVERY 6 HOURS AS NEEDED FOR WHEEZING OR SHORTNESS OF BREATH     Pulmonology:  Beta Agonists Failed - 08/18/2019  8:51 AM      Failed - One inhaler should last at least one month. If the patient is requesting refills earlier, contact the patient to check for uncontrolled symptoms.      Passed - Valid encounter within last 12 months    Recent Outpatient Visits          3 months ago Mucopurulent chronic bronchitis [facility])   [facility] Steele Sizer, MD   6 months ago Mucopurulent chronic bronchitis [facility])   [facility] Steele Sizer, MD   10 months ago Mucopurulent chronic bronchitis [facility])   [facility] Steele Sizer, MD      Future Appointments            In 28 months [facility], [facility]

## 2019-08-19 ENCOUNTER — Other Ambulatory Visit: Payer: Self-pay | Admitting: Family Medicine

## 2019-08-19 DIAGNOSIS — F339 Major depressive disorder, recurrent, unspecified: Secondary | ICD-10-CM

## 2019-08-19 DIAGNOSIS — I10 Essential (primary) hypertension: Secondary | ICD-10-CM

## 2019-08-19 NOTE — Telephone Encounter (Signed)
lvm for pt to call and schedule an appt °

## 2019-09-14 ENCOUNTER — Encounter: Payer: Self-pay | Admitting: Student in an Organized Health Care Education/Training Program

## 2019-09-15 ENCOUNTER — Ambulatory Visit
Payer: Medicare Other | Attending: Student in an Organized Health Care Education/Training Program | Admitting: Student in an Organized Health Care Education/Training Program

## 2019-09-15 ENCOUNTER — Encounter: Payer: Self-pay | Admitting: Student in an Organized Health Care Education/Training Program

## 2019-09-15 ENCOUNTER — Other Ambulatory Visit: Payer: Self-pay

## 2019-09-15 DIAGNOSIS — M79601 Pain in right arm: Secondary | ICD-10-CM | POA: Diagnosis not present

## 2019-09-15 DIAGNOSIS — Z79891 Long term (current) use of opiate analgesic: Secondary | ICD-10-CM | POA: Diagnosis not present

## 2019-09-15 DIAGNOSIS — Z96611 Presence of right artificial shoulder joint: Secondary | ICD-10-CM | POA: Diagnosis not present

## 2019-09-15 DIAGNOSIS — Z9889 Other specified postprocedural states: Secondary | ICD-10-CM

## 2019-09-15 DIAGNOSIS — G8929 Other chronic pain: Secondary | ICD-10-CM

## 2019-09-15 DIAGNOSIS — M5136 Other intervertebral disc degeneration, lumbar region: Secondary | ICD-10-CM | POA: Diagnosis not present

## 2019-09-15 DIAGNOSIS — M79602 Pain in left arm: Secondary | ICD-10-CM

## 2019-09-15 DIAGNOSIS — G894 Chronic pain syndrome: Secondary | ICD-10-CM

## 2019-09-15 DIAGNOSIS — M67921 Unspecified disorder of synovium and tendon, right upper arm: Secondary | ICD-10-CM | POA: Diagnosis not present

## 2019-09-15 DIAGNOSIS — M19011 Primary osteoarthritis, right shoulder: Secondary | ICD-10-CM | POA: Diagnosis not present

## 2019-09-15 DIAGNOSIS — M7918 Myalgia, other site: Secondary | ICD-10-CM | POA: Diagnosis not present

## 2019-09-15 DIAGNOSIS — M47812 Spondylosis without myelopathy or radiculopathy, cervical region: Secondary | ICD-10-CM

## 2019-09-15 MED ORDER — HYDROCODONE-ACETAMINOPHEN 10-325 MG PO TABS: 1.0000 | ORAL_TABLET | Freq: Three times a day (TID) | ORAL | 0 refills | Status: DC | PRN

## 2019-09-15 NOTE — Progress Notes (Signed)
Pain Management Virtual Encounter Note - Virtual Visit via [place] (real-time audio visits between healthcare provider and patient).   Patient's Phone No. & Preferred Pharmacy:  [phone] (home); [phone] (mobile); (Preferred) [phone] [email or web address]  [id number] - [place], [place] - [address] [address] Phone: [phone] Fax: [phone]    Pre-screening note:  Our staff contacted Ms. Nanez and offered her an "in person", "face-to-face" appointment versus a telephone encounter. She indicated preferring the telephone encounter, at this time.   Reason for Virtual Visit: COVID-19*  Social distancing based on CDC and AMA recommendations.   I contacted Lamonte Richer on 09/15/2019 via video conference.      I clearly identified myself as Gillis Santa, MD. I verified that I was speaking with the correct person using two identifiers (Name: Ovetta Bazzano, and date of birth: 1946-04-15).  Advanced Informed Consent I sought verbal advanced consent from Lamonte Richer for virtual visit interactions. I informed Ms. Wenke of possible security and privacy concerns, risks, and limitations associated with providing "not-in-person" medical evaluation and management services. I also informed Ms. Eskridge of the availability of "in-person" appointments. Finally, I informed her that there would be a charge for the virtual visit and that she could be  personally, fully or partially, financially responsible for it. Ms. Guizar expressed understanding and agreed to proceed.   Historic Elements   Ms. Winola Drum is a 73 y.o. year old, female patient evaluated today after her last encounter by our practice on 07/21/2019. Ms. Severa  has a past medical history of Anxiety, Arthritis, COPD (chronic obstructive pulmonary disease) ([place]), DDD (degenerative disc disease), lumbar, Depression, Generalized OA, GERD (gastroesophageal reflux disease),  Hypertension, Osteoporosis, Sleep apnea, and Substance abuse ([place]). She also  has a past surgical history that includes Rotator cuff repair; Appendectomy; Cervical spine surgery; and Carpal tunnel release. Ms. Schewe has a current medication list which includes the following prescription(s): amlodipine, calcium-vitamin d, gabapentin, losartan, pantoprazole, paroxetine, ventolin hfa, vitamin d, hydrocodone-acetaminophen, hydrocodone-acetaminophen, and trazodone. She  reports that she has been smoking cigarettes. She has a 12.50 pack-year smoking history. She has never used smokeless tobacco. She reports current alcohol use. She reports previous drug use. Drug: IV. Ms. Rabadan has No Known Allergies.   HPI  Today, she is being contacted for medication management.   No change in medical history since last visit.  Patient's pain is at baseline.  Patient continues multimodal pain regimen as prescribed.  States that it provides pain relief and improvement in functional status.   Pharmacotherapy Assessment  Analgesic: 08/20/2019  2   07/21/2019  Hydrocodone-Acetamin 10-325 MG  90.00  30 Bi Lat   [id number]   Wal (5798)   0  30.00 MME  Comm Ins   [place]    Monitoring: Pharmacotherapy: No side-effects or adverse reactions reported. [place] PMP: PDMP reviewed during this encounter.       Compliance: No problems identified. Effectiveness: Clinically acceptable. Plan: Refer to "POC".  UDS:  Summary  Date Value Ref Range Status  11/25/2018 FINAL  Final    Comment:    ==================================================================== TOXASSURE COMP DRUG ANALYSIS,UR ==================================================================== Test                             Result       Flag       Units Drug Present and Declared for Prescription Verification   Hydrocodone  35           EXPECTED   ng/mg creat    Sources of hydrocodone include scheduled prescription    medications.   Gabapentin                      PRESENT      EXPECTED   Paroxetine                     PRESENT      EXPECTED   Trazodone                      PRESENT      EXPECTED   1,3 chlorophenyl piperazine    PRESENT      EXPECTED    1,3-chlorophenyl piperazine is an expected metabolite of    trazodone.   Acetaminophen                  PRESENT      EXPECTED Drug Present not Declared for Prescription Verification   Cyclobenzaprine                PRESENT      UNEXPECTED   Desmethylcyclobenzaprine       PRESENT      UNEXPECTED    Desmethylcyclobenzaprine is an expected metabolite of    cyclobenzaprine.   Naproxen                       PRESENT      UNEXPECTED   Diphenhydramine                PRESENT      UNEXPECTED ==================================================================== Test                      Result    Flag   Units      Ref Range   Creatinine              145              mg/dL      >=16>=20 ==================================================================== Declared Medications:  The flagging and interpretation on this report are based on the  following declared medications.  Unexpected results may arise from  inaccuracies in the declared medications.  **Note: The testing scope of this panel includes these medications:  Gabapentin  Hydrocodone (Hydrocodone-Acetaminophen)  Paroxetine  Trazodone  **Note: The testing scope of this panel does not include small to  moderate amounts of these reported medications:  Acetaminophen (Hydrocodone-Acetaminophen)  **Note: The testing scope of this panel does not include following  reported medications:  Albuterol  Amlodipine  Losartan  Pantoprazole ==================================================================== For clinical consultation, please call [phone]. ====================================================================    Laboratory Chemistry Profile (12 mo)  Renal: 01/23/2019: BUN 12; BUN/Creatinine Ratio 13; Creat 0.94  Lab Results   Component Value Date   GFRAA 70 01/23/2019   GFRNONAA 60 01/23/2019   Hepatic: No results found for requested labs within last 8760 hours. Lab Results  Component Value Date   AST 25 01/23/2019   ALT 15 01/23/2019   Other: 01/23/2019: Vit D, 25-Hydroxy 10 Note: Above Lab results reviewed.   Assessment  The primary encounter diagnosis was Chronic pain syndrome. Diagnoses of Chronic pain of both upper extremities, History of shoulder surgery, Myofascial pain syndrome, Cervical spondylosis, Arthropathy of right shoulder, H/O total shoulder replacement, right, Long term current use of opiate analgesic, DDD (  degenerative disc disease), lumbar, Disorder of tendon of right biceps, and H/O cervical spine surgery were also pertinent to this visit.  Plan of Care  I am having [name] start on HYDROcodone-acetaminophen. I am also having her maintain her traZODone, Vitamin D, pantoprazole, gabapentin, calcium-vitamin D, Ventolin HFA, amLODipine, losartan, PARoxetine, and HYDROcodone-acetaminophen.  General Recommendations: The pain condition that the patient suffers from is best treated with a multidisciplinary approach that involves an increase in physical activity to prevent de-conditioning and worsening of the pain cycle, as well as psychological counseling (formal and/or informal) to address the co-morbid psychological affects of pain. Treatment will often involve judicious use of pain medications and interventional procedures to decrease the pain, allowing the patient to participate in the physical activity that will ultimately produce long-lasting pain reductions. The goal of the multidisciplinary approach is to return the patient to a higher level of overall function and to restore their ability to perform activities of daily living.  Pharmacotherapy (Medications Ordered): Meds ordered this encounter  Medications  . HYDROcodone-acetaminophen (NORCO) 10-325 MG tablet    Sig: Take 1 tablet by  mouth every 8 (eight) hours as needed for severe pain.    Dispense:  90 tablet    Refill:  0    Do not place this medication, or any other prescription from our practice, on "Automatic Refill". Patient may have prescription filled one day early if pharmacy is closed on scheduled refill date.  Marland Kitchen HYDROcodone-acetaminophen (NORCO) 10-325 MG tablet    Sig: Take 1 tablet by mouth every 8 (eight) hours as needed for severe pain.    Dispense:  90 tablet    Refill:  0    Do not place this medication, or any other prescription from our practice, on "Automatic Refill". Patient may have prescription filled one day early if pharmacy is closed on scheduled refill date.   Follow-up plan:   Return in about 8 weeks (around 11/10/2019) for Medication Management, in person (UDS at next visit).     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    Recent Visits Date Type Provider Dept  07/21/19 Office Visit Edward Jolly, MD [facility]  Showing recent visits within past 90 days and meeting all other requirements   Today's Visits Date Type Provider Dept  09/15/19 Office Visit Edward Jolly, MD [facility]  Showing today's visits and meeting all other requirements   Future Appointments No visits were found meeting these conditions.  Showing future appointments within next 90 days and meeting all other requirements   I discussed the assessment and treatment plan with the patient. The patient was provided an opportunity to ask questions and all were answered. The patient agreed with the plan and demonstrated an understanding of the instructions.  Patient advised to call back or seek an in-person evaluation if the symptoms or condition worsens.  Total duration  of non-face-to-face encounter: 25 minutes.  Note by: Edward Jolly, MD Date: 09/15/2019; Time: 11:23 AM  Note: This dictation was prepared with Dragon dictation. Any transcriptional errors that may result from this process are unintentional.  Disclaimer:  * Given the special circumstances of the COVID-19 pandemic, the federal government has announced that the Office for Civil Rights (OCR) will exercise its enforcement discretion and will not impose penalties on physicians using telehealth in the event  of noncompliance with regulatory requirements under the [employer] and Accountability Act (HIPAA) in connection with the good faith provision of telehealth during the COVID-19 national public health emergency. (AMA)

## 2019-10-27 ENCOUNTER — Encounter: Payer: Self-pay | Admitting: Family Medicine

## 2019-10-27 ENCOUNTER — Ambulatory Visit (INDEPENDENT_AMBULATORY_CARE_PROVIDER_SITE_OTHER): Payer: Medicare Other | Admitting: Family Medicine

## 2019-10-27 ENCOUNTER — Other Ambulatory Visit: Payer: Self-pay

## 2019-10-27 VITALS — BP 132/57 | HR 72 | Resp 18 | Wt 172.0 lb

## 2019-10-27 DIAGNOSIS — F339 Major depressive disorder, recurrent, unspecified: Secondary | ICD-10-CM

## 2019-10-27 DIAGNOSIS — B182 Chronic viral hepatitis C: Secondary | ICD-10-CM | POA: Insufficient documentation

## 2019-10-27 DIAGNOSIS — E559 Vitamin D deficiency, unspecified: Secondary | ICD-10-CM | POA: Diagnosis not present

## 2019-10-27 DIAGNOSIS — F5104 Psychophysiologic insomnia: Secondary | ICD-10-CM

## 2019-10-27 DIAGNOSIS — K219 Gastro-esophageal reflux disease without esophagitis: Secondary | ICD-10-CM | POA: Diagnosis not present

## 2019-10-27 DIAGNOSIS — M810 Age-related osteoporosis without current pathological fracture: Secondary | ICD-10-CM | POA: Diagnosis not present

## 2019-10-27 DIAGNOSIS — Z9889 Other specified postprocedural states: Secondary | ICD-10-CM

## 2019-10-27 DIAGNOSIS — R7989 Other specified abnormal findings of blood chemistry: Secondary | ICD-10-CM | POA: Diagnosis not present

## 2019-10-27 DIAGNOSIS — J411 Mucopurulent chronic bronchitis: Secondary | ICD-10-CM

## 2019-10-27 DIAGNOSIS — G894 Chronic pain syndrome: Secondary | ICD-10-CM

## 2019-10-27 MED ORDER — PANTOPRAZOLE SODIUM 40 MG PO TBEC: 40.0000 mg | DELAYED_RELEASE_TABLET | Freq: Every day | ORAL | 1 refills | Status: DC

## 2019-10-27 MED ORDER — TRAZODONE HCL 50 MG PO TABS: 50.0000 mg | ORAL_TABLET | Freq: Every evening | ORAL | 1 refills | Status: DC | PRN

## 2019-10-27 MED ORDER — ALBUTEROL SULFATE HFA 108 (90 BASE) MCG/ACT IN AERS: INHALATION_SPRAY | RESPIRATORY_TRACT | 2 refills | Status: AC

## 2019-10-27 NOTE — Progress Notes (Signed)
Name: Natalie Lloyd   MRN: [id number]    DOB: 1946-05-24   Date:10/27/2019       Progress Note  Subjective  Chief Complaint  Chief Complaint  Patient presents with  . Hypertension  . Depression  . COPD  . Gastroesophageal Reflux    I connected with  [name]  on 10/27/19 at 10:00 AM EST by a video enabled telemedicine application and verified that I am speaking with the correct person using two identifiers.  I discussed the limitations of evaluation and management by telemedicine and the availability of in person appointments. The patient expressed understanding and agreed to proceed. Staff also discussed with the patient that there may be a patient responsible charge related to this service. Patient Location: at home  Provider Location: [facility]   HPI  COPD : diagnosed years ago, not on maintenance medication and does not want it at this time. She states very sick this past Spring, she had pneumoniaSpring 2019 and was hospitalized in [place]. She states SOB and cough got worse after the pneumonia, but is back to her baseline. She uses Albuterol prn at most a few times a week and does not want to take a daily medication   HTN: she is taking medications,bp is at goal, no dizziness, chest pain or palpitation. BP at home was atgoal   Osteoporosis: she has not been taking medication, I still don't have her old records from [place]. Explained that she needs to have a repeat bone density and she states she will wait for now because of COVID-19   Chronic pain: she had multiple surgeries, she has DDD spine and also right shoulder surgery and history of dislocation, she was seeing pain clinic at [facility] now under the care of Dr. Cherylann Ratel and NP Marena Chancy , taking hydrocodone TID, gabapentin and seems to be doing okay. She states pain level at this time is 4/10 , medication keeps the pain below 5. Pain is much worse during cold days  GERD: she is on  pantoprazole and she states no heartburn or regurgitation with medication, but unable to come off medication. She is aware of long term risk. Unchanged   Major Depression: she has a long history of depression, states she used to go weeks without living her house because of sadness. She started medication many years ago - about 15 years. Medication works well for her.She is happy living in [place] with her daughter Cala Bradford, she still missed family that are in [place] but is happy when her 40 yo grandson calls her daily . Phq 9 negative   Insomnia: she takes Trazodone prn only , she states when she takes medications she sleeps for about 12 hours and feels very rested, however she ran out of medication about one month ago and has not been sleeping well since and needs a refill   Elevated TSH and PTH: explain need to return for labs but she is afraid to come in due to COVID-19  Chronic hepatitis C: she was offered therapy in the past but she refused , discussed referral to GI now but she refuses   Patient Active Problem List   Diagnosis Date Noted  . Chronic hepatitis C without hepatic coma (HCC) 10/27/2019  . Long term current use of opiate analgesic 01/27/2019  . Chronic upper extremity pain (R>L) 01/27/2019  . Elevated TSH 01/27/2019  . Elevated PTHrP level 01/27/2019  . Vitamin D deficiency 01/27/2019  . H/O cervical spine surgery 12/02/2018  .  Disorder of tendon of right biceps 12/02/2018  . Pain in joint of right shoulder 12/02/2018  . 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 Chronic pain syndrome 12/02/2018  . Cervical spondylosis 12/02/2018  . COPD (chronic obstructive pulmonary disease) (HCC) 10/22/2018  . Hypertension 10/22/2018  . Sleep apnea 10/22/2018  . GERD (gastroesophageal reflux disease) 10/22/2018  . Osteoporosis 10/22/2018  . History of pneumonia 10/22/2018  . Major depression, recurrent, chronic (HCC) 10/22/2018  . Chronic insomnia 10/22/2018  . DDD  (degenerative disc disease), lumbar 10/22/2018    Past Surgical History:  Procedure Laterality Date  . APPENDECTOMY    . CARPAL TUNNEL RELEASE    . CERVICAL SPINE SURGERY    . ROTATOR CUFF REPAIR      History reviewed. No pertinent family history.  Social History   Socioeconomic History  . Marital status: Widowed    Spouse name: Benne  . Number of children: 5  . Years of education: Not on file  . Highest education level: Bachelor's degree (e.g., BA, AB, BS)  Occupational History  . Occupation: Disabled  Social Needs  . Financial resource strain: Not very hard  . Food insecurity    Worry: Never true    Inability: Never true  . Transportation needs    Medical: No    Non-medical: No  Tobacco Use  . Smoking status: Current Some Day Smoker    Packs/day: 0.25    Years: 50.00    Pack years: 12.50    Types: Cigarettes  . Smokeless tobacco: Never Used  . Tobacco comment: 3-4 cigarettes per day  Substance and Sexual Activity  . Alcohol use: Yes    Comment: occ wine cooler  . Drug use: Not Currently    Types: IV    Comment: 37.5 yrs ago  . Sexual activity: Not Currently    Partners: Male    Birth control/protection: None  Lifestyle  . Physical activity    Days per week: 7 days    Minutes per session: 20 min  . Stress: Not at all  Relationships  . Social connections    Talks on phone: More than three times a week    Gets together: Never    Attends religious service: 1 to 4 times per year    Active member of club or organization: No    Attends meetings of clubs or organizations: Never    Relationship status: Widowed  . Intimate partner violence    Fear of current or ex partner: No    Emotionally abused: No    Physically abused: No    Forced sexual activity: No  Other Topics Concern  . Not on file  Social History Narrative   Patient has 5 grown kids, 16 or 17 grandkids & about 3 great-grandkids.      Widowed for 4 yrs (10/6) now after 3yrs      Patient has  moved here from [place].     Current Outpatient Medications:  .  albuterol (VENTOLIN HFA) 108 (90 Base) MCG/ACT inhaler, INHALE 2 PUFFS INTO THE LUNGS EVERY 6 HOURS AS NEEDED FOR WHEEZING OR SHORTNESS OF BREATH, Disp: 18 g, Rfl: 2 .  amLODipine (NORVASC) 5 MG tablet, TAKE 1 TABLET BY MOUTH EVERY DAY, Disp: 90 tablet, Rfl: 1 .  calcium-vitamin D (OSCAL WITH D) 500-200 MG-UNIT tablet, Take 1 tablet by mouth., Disp: , Rfl:  .  Cholecalciferol (VITAMIN D) 50 MCG (2000 UT) CAPS, Take 1 capsule (2,000 Units total) by mouth daily.,  Disp: 100 capsule, Rfl: 1 .  gabapentin (NEURONTIN) 100 MG capsule, Take 1 capsule (100 mg total) by mouth 3 (three) times daily., Disp: 90 capsule, Rfl: 5 .  HYDROcodone-acetaminophen (NORCO) 10-325 MG tablet, Take 1 tablet by mouth every 8 (eight) hours as needed for severe pain., Disp: 90 tablet, Rfl: 0 .  losartan (COZAAR) 100 MG tablet, TAKE 1 TABLET BY MOUTH EVERY DAY, Disp: 90 tablet, Rfl: 1 .  pantoprazole (PROTONIX) 40 MG tablet, Take 1 tablet (40 mg total) by mouth daily., Disp: 90 tablet, Rfl: 1 .  PARoxetine (PAXIL) 40 MG tablet, TAKE 1 TAB BY MOUTH EVERY MORNING, Disp: 90 tablet, Rfl: 1 .  traZODone (DESYREL) 50 MG tablet, Take 1 tablet (50 mg total) by mouth at bedtime as needed for sleep., Disp: 90 tablet, Rfl: 1  No Known Allergies  I personally reviewed active problem list, medication list, allergies, family history, social history, health maintenance with the patient/caregiver today.   ROS  Ten systems reviewed and is negative except as mentioned in HPI   Objective  Virtual encounter, vitals obtained at home  Vitals:   10/27/19 0816  BP: (!) 132/57  Pulse: 72  Resp: 18    Body mass index is 26.15 kg/m.  Physical Exam  Awake, alert and oriented   PHQ2/9: Depression screen [facility] 2/9 10/27/2019 07/24/2019 04/24/2019 01/27/2019 01/23/2019  Decreased Interest 0 0 0 0 0  Down, Depressed, Hopeless 0 0 0 0 0  PHQ - 2 Score 0 0 0 0 0   Altered sleeping 0 1 1 0 1  Tired, decreased energy 0 1 0 0 1  Change in appetite 0 0 1 0 0  Feeling bad or failure about yourself  0 0 0 0 0  Trouble concentrating 0 1 2 0 0  Moving slowly or fidgety/restless 0 0 1 0 0  Suicidal thoughts 0 0 0 0 0  PHQ-9 Score 0 3 5 0 2  Difficult doing work/chores - Not difficult at all Not difficult at all - Not difficult at all   PHQ-2/9 Result is negative.    Fall Risk: Fall Risk  10/27/2019 07/24/2019 04/24/2019 01/27/2019 01/23/2019  Falls in the past year? 1 1 0 0 0  Number falls in past yr: 1 1 0 0 -  Injury with Fall? 1 0 0 0 -  Risk for fall due to : History of fall(s) Impaired balance/gait - - Impaired balance/gait  Follow up Education provided Falls prevention discussed - - -     Assessment & Plan  1. Gastroesophageal reflux disease without esophagitis  - pantoprazole (PROTONIX) 40 MG tablet; Take 1 tablet (40 mg total) by mouth daily.  Dispense: 90 tablet; Refill: 1  2. Chronic insomnia  - traZODone (DESYREL) 50 MG tablet; Take 1 tablet (50 mg total) by mouth at bedtime as needed for sleep.  Dispense: 90 tablet; Refill: 1  3. Chronic pain syndrome  Keep follow up with Dr. Holley Raring   4. History of shoulder surgery   5. Mucopurulent chronic bronchitis (HCC)  - albuterol (VENTOLIN HFA) 108 (90 Base) MCG/ACT inhaler; INHALE 2 PUFFS INTO THE LUNGS EVERY 6 HOURS AS NEEDED FOR WHEEZING OR SHORTNESS OF BREATH  Dispense: 18 g; Refill: 2  6. Major depression, recurrent, chronic (HCC)  Continue medications   7. Age-related osteoporosis without current pathological fracture  Continue vitamin D and high calcium diet   8. Chronic hepatitis C without hepatic coma (HCC)  Refuses therapy   9. Vitamin  D deficiency  Take vitamin D supplementation   10. Elevated PTHrP level   11. Elevated TSH  Needs to return for labs   I discussed the assessment and treatment plan with the patient. The patient was provided an opportunity to ask  questions and all were answered. The patient agreed with the plan and demonstrated an understanding of the instructions.  The patient was advised to call back or seek an in-person evaluation if the symptoms worsen or if the condition fails to improve as anticipated.  I provided 25 minutes of non-face-to-face time during this encounter.

## 2019-11-17 ENCOUNTER — Other Ambulatory Visit: Payer: Self-pay

## 2019-11-17 ENCOUNTER — Telehealth: Payer: Self-pay

## 2019-11-17 ENCOUNTER — Ambulatory Visit
Payer: Medicare Other | Attending: Student in an Organized Health Care Education/Training Program | Admitting: Student in an Organized Health Care Education/Training Program

## 2019-11-17 ENCOUNTER — Encounter: Payer: Self-pay | Admitting: Student in an Organized Health Care Education/Training Program

## 2019-11-17 DIAGNOSIS — M47812 Spondylosis without myelopathy or radiculopathy, cervical region: Secondary | ICD-10-CM

## 2019-11-17 DIAGNOSIS — M5136 Other intervertebral disc degeneration, lumbar region: Secondary | ICD-10-CM

## 2019-11-17 DIAGNOSIS — G8929 Other chronic pain: Secondary | ICD-10-CM

## 2019-11-17 DIAGNOSIS — M79601 Pain in right arm: Secondary | ICD-10-CM

## 2019-11-17 DIAGNOSIS — M19011 Primary osteoarthritis, right shoulder: Secondary | ICD-10-CM | POA: Diagnosis not present

## 2019-11-17 DIAGNOSIS — Z96611 Presence of right artificial shoulder joint: Secondary | ICD-10-CM | POA: Diagnosis not present

## 2019-11-17 DIAGNOSIS — M7918 Myalgia, other site: Secondary | ICD-10-CM

## 2019-11-17 DIAGNOSIS — Z9889 Other specified postprocedural states: Secondary | ICD-10-CM

## 2019-11-17 DIAGNOSIS — M67921 Unspecified disorder of synovium and tendon, right upper arm: Secondary | ICD-10-CM | POA: Diagnosis not present

## 2019-11-17 DIAGNOSIS — M79602 Pain in left arm: Secondary | ICD-10-CM

## 2019-11-17 DIAGNOSIS — Z79891 Long term (current) use of opiate analgesic: Secondary | ICD-10-CM | POA: Diagnosis not present

## 2019-11-17 DIAGNOSIS — G894 Chronic pain syndrome: Secondary | ICD-10-CM | POA: Diagnosis not present

## 2019-11-17 MED ORDER — HYDROCODONE-ACETAMINOPHEN 10-325 MG PO TABS: 1.0000 | ORAL_TABLET | Freq: Three times a day (TID) | ORAL | 0 refills | Status: DC | PRN

## 2019-11-17 NOTE — Progress Notes (Signed)
Pain Management Virtual Encounter Note - Virtual Visit via Video Conference Telehealth (real-time audio visits between healthcare provider and patient).   Patient's Phone No. & Preferred Pharmacy:  [phone] (home); [phone] (mobile); (Preferred) [phone] [email or web address]  [id number] - [place], [place] - [address] [place] [address] Phone: [phone] Fax: [phone]    Pre-screening note:  Our staff contacted Natalie Lloyd and offered her an "in person", "face-to-face" appointment versus a telephone encounter. She indicated preferring the telephone encounter, at this time.   Reason for Virtual Visit: COVID-19*  Social distancing based on CDC and AMA recommendations.   I contacted Natalie Lloyd on 11/17/2019 via video conference.      I clearly identified myself as Edward Jolly, MD. I verified that I was speaking with the correct person using two identifiers (Name: Natalie Lloyd, and date of birth: July 31, 1946).  Advanced Informed Consent I sought verbal advanced consent from Natalie Lloyd for virtual visit interactions. I informed Natalie Lloyd of possible security and privacy concerns, risks, and limitations associated with providing "not-in-person" medical evaluation and management services. I also informed Natalie Lloyd of the availability of "in-person" appointments. Finally, I informed her that there would be a charge for the virtual visit and that she could be  personally, fully or partially, financially responsible for it. Natalie Lloyd expressed understanding and agreed to proceed.   Historic Elements   Natalie Lloyd is a 73 y.o. year old, female patient evaluated today after her last encounter by our practice on 11/17/2019. Natalie Lloyd  has a past medical history of Anxiety, Arthritis, COPD (chronic obstructive pulmonary disease) (HCC), DDD (degenerative disc disease), lumbar, Depression, Generalized OA, GERD (gastroesophageal reflux disease),  Hypertension, Osteoporosis, Sleep apnea, and Substance abuse (HCC). She also  has a past surgical history that includes Rotator cuff repair; Appendectomy; Cervical spine surgery; and Carpal tunnel release. Natalie Lloyd has a current medication list which includes the following prescription(s): albuterol, amlodipine, calcium-vitamin d, vitamin d, gabapentin, hydrocodone-acetaminophen, hydrocodone-acetaminophen, hydrocodone-acetaminophen, losartan, pantoprazole, paroxetine, and trazodone. She  reports that she has been smoking cigarettes. She has a 12.50 pack-year smoking history. She has never used smokeless tobacco. She reports current alcohol use. She reports previous drug use. Drug: IV. Natalie Lloyd has No Known Allergies.   HPI  Today, she is being contacted for medication management.   No change in medical history since last visit.  Patient's pain is at baseline.  Patient continues multimodal pain regimen as prescribed.  States that it provides pain relief and improvement in functional status.  Pharmacotherapy Assessment  Analgesic: 10/18/2019  2   09/15/2019  Hydrocodone-Acetamin 10-325 MG  90.00  30 Bi Lat   [id number]   Nor (1409)   0  30.00 MME  Medicare   [place]    Monitoring: Pharmacotherapy: No side-effects or adverse reactions reported. [place] PMP: PDMP reviewed during this encounter.       Compliance: No problems identified. Effectiveness: Clinically acceptable. Plan: Refer to "POC".  UDS:  Summary  Date Value Ref Range Status  11/25/2018 FINAL  Final    Comment:    ==================================================================== [facility] COMP DRUG ANALYSIS,UR ==================================================================== Test                             Result       Flag       Units Drug Present and Declared for Prescription Verification   Hydrocodone  35           EXPECTED   ng/mg creat    Sources of hydrocodone include scheduled prescription     medications.   Gabapentin                     PRESENT      EXPECTED   Paroxetine                     PRESENT      EXPECTED   Trazodone                      PRESENT      EXPECTED   1,3 chlorophenyl piperazine    PRESENT      EXPECTED    1,3-chlorophenyl piperazine is an expected metabolite of    trazodone.   Acetaminophen                  PRESENT      EXPECTED Drug Present not Declared for Prescription Verification   Cyclobenzaprine                PRESENT      UNEXPECTED   Desmethylcyclobenzaprine       PRESENT      UNEXPECTED    Desmethylcyclobenzaprine is an expected metabolite of    cyclobenzaprine.   Naproxen                       PRESENT      UNEXPECTED   Diphenhydramine                PRESENT      UNEXPECTED ==================================================================== Test                      Result    Flag   Units      Ref Range   Creatinine              145              mg/dL      >=20 ==================================================================== Declared Medications:  The flagging and interpretation on this report are based on the  following declared medications.  Unexpected results may arise from  inaccuracies in the declared medications.  **Note: The testing scope of this panel includes these medications:  Gabapentin  Hydrocodone (Hydrocodone-Acetaminophen)  Paroxetine  Trazodone  **Note: The testing scope of this panel does not include small to  moderate amounts of these reported medications:  Acetaminophen (Hydrocodone-Acetaminophen)  **Note: The testing scope of this panel does not include following  reported medications:  Albuterol  Amlodipine  Losartan  Pantoprazole ==================================================================== For clinical consultation, please call [phone]. ====================================================================    Laboratory Chemistry Profile (12 mo)  Renal: 01/23/2019: BUN 12; BUN/Creatinine Ratio  13; Creat 0.94  Lab Results  Component Value Date   GFRAA 70 01/23/2019   GFRNONAA 60 01/23/2019   Hepatic: No results found for requested labs within last 8760 hours. Lab Results  Component Value Date   AST 25 01/23/2019   ALT 15 01/23/2019   Other: 01/23/2019: Vit D, 25-Hydroxy 10 Note: Above Lab results reviewed.   Assessment  The primary encounter diagnosis was Chronic pain syndrome. Diagnoses of Chronic pain of both upper extremities, History of shoulder surgery, Myofascial pain syndrome, Cervical spondylosis, Arthropathy of right shoulder, H/O total shoulder replacement, right, Long term current use of opiate analgesic, DDD (  degenerative disc disease), lumbar, and Disorder of tendon of right biceps were also pertinent to this visit.  Plan of Care   I am having Natalie Gasserarolyn Isadore start on HYDROcodone-acetaminophen and HYDROcodone-acetaminophen. I am also having her maintain her Vitamin D, gabapentin, calcium-vitamin D, amLODipine, losartan, PARoxetine, pantoprazole, traZODone, albuterol, and HYDROcodone-acetaminophen.  Pharmacotherapy (Medications Ordered): Meds ordered this encounter  Medications  . HYDROcodone-acetaminophen (NORCO) 10-325 MG tablet    Sig: Take 1 tablet by mouth every 8 (eight) hours as needed for severe pain.    Dispense:  90 tablet    Refill:  0    Do not place this medication, or any other prescription from our practice, on "Automatic Refill". Patient may have prescription filled one day early if pharmacy is closed on scheduled refill date.  Marland Kitchen. HYDROcodone-acetaminophen (NORCO) 10-325 MG tablet    Sig: Take 1 tablet by mouth every 8 (eight) hours as needed for severe pain.    Dispense:  90 tablet    Refill:  0    Do not place this medication, or any other prescription from our practice, on "Automatic Refill". Patient may have prescription filled one day early if pharmacy is closed on scheduled refill date.  Marland Kitchen. HYDROcodone-acetaminophen (NORCO) 10-325 MG tablet     Sig: Take 1 tablet by mouth every 8 (eight) hours as needed for severe pain.    Dispense:  90 tablet    Refill:  0    Do not place this medication, or any other prescription from our practice, on "Automatic Refill". Patient may have prescription filled one day early if pharmacy is closed on scheduled refill date.   Orders:  Orders Placed This Encounter  Procedures  . UDS (Compliance-13) ([facility]) ([facility]) (Established Pt.)    Volume: 30 ml(s). Minimum 3 ml of urine is needed. Document temperature of fresh sample. Indications: Long term (current) use of opiate analgesic (Z61.096(Z79.891)   Follow-up plan:   Return in about 3 months (around 02/15/2020) for Medication Management.     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     Recent Visits Date Type Provider Dept  09/15/19 Office Visit Edward JollyLateef, Demarius Archila, MD [facility]  Showing recent visits within past 90 days and meeting all other requirements   Today's Visits Date Type Provider Dept  11/17/19 Office Visit Edward JollyLateef, Lamonta Cypress, MD [facility]  Showing today's visits and meeting all other requirements   Future Appointments No visits were found meeting these conditions.  Showing future appointments within next 90 days and meeting all other requirements   I discussed the assessment and treatment plan with the patient. The patient was provided an opportunity to ask questions and all were answered. The patient agreed with the plan and demonstrated an understanding of the instructions.  Patient advised to call back or seek an in-person evaluation if the symptoms or condition worsens.  Total duration of non-face-to-face encounter: 25 minutes.  Note by: Edward JollyBilal Coleson Kant, MD Date: 11/17/2019; Time:  12:54 PM  Note: This dictation was prepared with Dragon dictation. Any transcriptional errors that may result from this process are unintentional.  Disclaimer:  * Given the special circumstances of the COVID-19 pandemic, the federal government has announced that the Office for Civil Rights (OCR) will exercise its enforcement discretion and will not impose penalties on physicians using telehealth in the event of  noncompliance with regulatory requirements under the [employer] and Accountability Act (HIPAA) in connection with the good faith provision of telehealth during the UGAYG-47 national public health emergency. (AMA)

## 2019-11-17 NOTE — Telephone Encounter (Signed)
Attempted to call patient. Left message on AM to call us back for information before visit. If not we may need to resch.

## 2020-02-01 ENCOUNTER — Other Ambulatory Visit: Payer: Self-pay | Admitting: Family Medicine

## 2020-02-01 DIAGNOSIS — Z9889 Other specified postprocedural states: Secondary | ICD-10-CM

## 2020-02-01 DIAGNOSIS — G894 Chronic pain syndrome: Secondary | ICD-10-CM

## 2020-02-01 NOTE — Telephone Encounter (Signed)
Medication Refill - Medication: gabapentin (NEURONTIN) 100 MG capsule    Has the patient contacted their pharmacy? Yes.   (Agent: If no, request that the patient contact the pharmacy for the refill.) (Agent: If yes, when and what did the pharmacy advise?)  Preferred Pharmacy (with phone number or street name):  [id number] - [place], [place] - [address]  [address] [place] [address]  Phone: [phone] Fax: [phone]     Agent: Please be advised that RX refills may take up to 3 business days. We ask that you follow-up with your pharmacy.

## 2020-02-01 NOTE — Addendum Note (Signed)
Addended by: Carlean Purl on: 02/01/2020 03:38 PM   Modules accepted: Orders

## 2020-02-02 MED ORDER — GABAPENTIN 100 MG PO CAPS: 100.0000 mg | ORAL_CAPSULE | Freq: Three times a day (TID) | ORAL | 1 refills | Status: DC

## 2020-02-03 ENCOUNTER — Other Ambulatory Visit
Admission: RE | Admit: 2020-02-03 | Discharge: 2020-02-03 | Disposition: A | Discharge status: Home / Self Care | Payer: Medicare Other | Source: Ambulatory Visit | Attending: Student in an Organized Health Care Education/Training Program | Admitting: Student in an Organized Health Care Education/Training Program

## 2020-02-03 DIAGNOSIS — G894 Chronic pain syndrome: Secondary | ICD-10-CM | POA: Insufficient documentation

## 2020-02-03 LAB — URINE DRUG SCREEN, QUALITATIVE (ARMC ONLY)
Amphetamines, Ur Screen: NOT DETECTED
Barbiturates, Ur Screen: NOT DETECTED
Benzodiazepine, Ur Scrn: NOT DETECTED
Cannabinoid 50 Ng, Ur ~~LOC~~: NOT DETECTED
Cocaine Metabolite,Ur ~~LOC~~: NOT DETECTED
MDMA (Ecstasy)Ur Screen: NOT DETECTED
Methadone Scn, Ur: NOT DETECTED
Opiate, Ur Screen: POSITIVE — AB
Phencyclidine (PCP) Ur S: NOT DETECTED
Tricyclic, Ur Screen: NOT DETECTED

## 2020-02-11 ENCOUNTER — Encounter: Payer: Self-pay | Admitting: Student in an Organized Health Care Education/Training Program

## 2020-02-11 ENCOUNTER — Other Ambulatory Visit: Payer: Self-pay

## 2020-02-11 ENCOUNTER — Telehealth: Payer: Self-pay | Admitting: *Deleted

## 2020-02-11 ENCOUNTER — Ambulatory Visit
Payer: Medicare Other | Attending: Student in an Organized Health Care Education/Training Program | Admitting: Student in an Organized Health Care Education/Training Program

## 2020-02-11 DIAGNOSIS — Z9889 Other specified postprocedural states: Secondary | ICD-10-CM | POA: Diagnosis not present

## 2020-02-11 DIAGNOSIS — Z79891 Long term (current) use of opiate analgesic: Secondary | ICD-10-CM | POA: Diagnosis not present

## 2020-02-11 DIAGNOSIS — M7918 Myalgia, other site: Secondary | ICD-10-CM

## 2020-02-11 DIAGNOSIS — G8929 Other chronic pain: Secondary | ICD-10-CM | POA: Diagnosis not present

## 2020-02-11 DIAGNOSIS — G894 Chronic pain syndrome: Secondary | ICD-10-CM

## 2020-02-11 DIAGNOSIS — M47812 Spondylosis without myelopathy or radiculopathy, cervical region: Secondary | ICD-10-CM

## 2020-02-11 DIAGNOSIS — M79601 Pain in right arm: Secondary | ICD-10-CM

## 2020-02-11 DIAGNOSIS — M19011 Primary osteoarthritis, right shoulder: Secondary | ICD-10-CM

## 2020-02-11 DIAGNOSIS — M79602 Pain in left arm: Secondary | ICD-10-CM | POA: Diagnosis not present

## 2020-02-11 DIAGNOSIS — Z96611 Presence of right artificial shoulder joint: Secondary | ICD-10-CM | POA: Diagnosis not present

## 2020-02-11 MED ORDER — HYDROCODONE-ACETAMINOPHEN 10-325 MG PO TABS: 1.0000 | ORAL_TABLET | Freq: Three times a day (TID) | ORAL | 0 refills | Status: DC | PRN

## 2020-02-11 NOTE — Progress Notes (Signed)
Patient: Natalie Lloyd  Service Category: E/M  Provider: Gillis Santa, MD  DOB: 1946/01/12  DOS: 02/11/2020  Location: Office  MRN: [id number]  Setting: Ambulatory outpatient  Referring Provider: Steele Sizer, MD  Type: Established Patient  Specialty: Interventional Pain Management  PCP: Steele Sizer, MD  Location: Home  Delivery: TeleHealth     Virtual Encounter - Pain Management PROVIDER NOTE: Information contained herein reflects review and annotations entered in association with encounter. Interpretation of such information and data should be left to medically-trained personnel. Information provided to patient can be located elsewhere in the medical record under "Patient Instructions". Document created using STT-dictation technology, any transcriptional errors that may result from process are unintentional.    Contact & Pharmacy Preferred: [phone] Home: [phone] (home) Mobile: [phone] (mobile) E-mail: [email or web address]  [id number] [place] - [address] [place]Phone: [phone]: [phone]  Pre-screening  [name] "in-person" vs "virtual" encounter. She indicated preferring virtual for this encounter.   Reason COVID-19*  Social distancing based on CDC and AMA recommendations.   I contacted Natalie Lloyd 02/11/2020 via telephone.      I clearly identified myself as BGillis Santa MD. I verified that I was speaking with the correct person using two identifiers (Name: CDarcella Lloyd and date of birth: 9Mar 15, 1947.   This visit was completed via telephone due to the restrictions of the COVID-19 pandemic. All issues as above were discussed and addressed but no physical exam was performed. If it was felt that the patient should be evaluated in the office, they were directed there. The patient verbally consented to this visit. Patient was unable to complete an audio/visual visit due to Technical difficulties  and/or Lack of internet. Due to the catastrophic nature of the COVID-19 pandemic, this visit was done through audio contact only.  Location of the patient: home address (see Epic for details)  Location of the provider: office  Consent I sought verbal advanced consent from Natalie Richerfor virtual visit interactions. I informed [name], risks, and limitations associated with providing "not-in-person" medical evaluation and management services. I also informed Ms. WShaheenof the availability of "in-person" appointments. Finally, I informed her that there would be a charge for the virtual visit and that she could be  personally, fully or partially, financially responsible for it. [name] understanding and agreed to proceed.   Historic Elements   Ms. Natalie Patesis a 74y.o. year old, female patient evaluated today after her last contact with our practice on 11/17/2019. [name] has a past medical history of Anxiety, Arthritis, COPD (chronic obstructive pulmonary disease) (HPonca, DDD (degenerative disc disease), lumbar, Depression, Generalized OA, GERD (gastroesophageal reflux disease), Hypertension, Osteoporosis, Sleep apnea, and Substance abuse (HHarrison. She also  has a past surgical history that includes Rotator cuff repair; Appendectomy; Cervical spine surgery; and Carpal tunnel release. Ms. WOrebaughhas a current medication list which includes the following prescription(s): albuterol, amlodipine, calcium-vitamin d, vitamin d, gabapentin, [START ON 02/14/2020] hydrocodone-acetaminophen, [START ON 03/15/2020] hydrocodone-acetaminophen, [START ON 04/14/2020] hydrocodone-acetaminophen, losartan, pantoprazole, paroxetine, and trazodone. She  reports that she has been smoking cigarettes. She has a 12.50 pack-year smoking history. She has never used smokeless tobacco. She reports current alcohol use. She reports previous drug use. Drug: IV. Ms. WGuilfoilhas No  Known Allergies.   HPI  Today, she is being contacted for medication management.   No change in  medical history since last visit.  Patient's pain is at baseline.  Patient continues multimodal pain regimen as prescribed.  States that it provides pain relief and improvement in functional status.   Pharmacotherapy Assessment  Analgesic:  01/16/2020  2   11/17/2019  Hydrocodone-Acetamin 10-325 MG  90.00  30 Bi Lat   [id number]   Nor (1409)   0  30.00 MME  Medicare   [place]     Monitoring: [place] PMP: PDMP reviewed during this encounter.       Pharmacotherapy: No side-effects or adverse reactions reported. Compliance: No problems identified. Effectiveness: Clinically acceptable. Plan: Refer to "POC".  UDS:   Status:  Final result Visible to patient:  No (inaccessible in MyChart) Next appt:  03/21/2020 at 10:20 AM in Internal Medicine [name], MD)  Ref Range & Units 8 d ago  Tricyclic, Ur Screen NONE DETECTED NONE DETECTED   Amphetamines, Ur Screen NONE DETECTED NONE DETECTED   MDMA (Ecstasy)Ur Screen NONE DETECTED NONE DETECTED   Cocaine Metabolite,Ur [place] NONE DETECTED NONE DETECTED   Opiate, Ur Screen NONE DETECTED POSITIVEAbnormal    Phencyclidine (PCP) Ur S NONE DETECTED NONE DETECTED   Cannabinoid 50 Ng, Ur [place] NONE DETECTED NONE DETECTED   Barbiturates, Ur Screen NONE DETECTED NONE DETECTED   Benzodiazepine, Ur Scrn NONE DETECTED NONE DETECTED   Methadone Scn, Ur NONE DETECTED NONE DETECTED   Comment: (NOTE)  Tricyclics + metabolites, urine  Cutoff 1000 ng/mL  Amphetamines + metabolites, urine Cutoff 1000 ng/mL  MDMA (Ecstasy), urine       Cutoff 500 ng/mL  Cocaine Metabolite, urine     Cutoff 300 ng/mL  Opiate + metabolites, urine    Cutoff 300 ng/mL  Phencyclidine (PCP), urine     Cutoff 25 ng/mL  Cannabinoid, urine         Cutoff 50 ng/mL  Barbiturates + metabolites, urine Cutoff 200 ng/mL  Benzodiazepine, urine       Cutoff 200  ng/mL  Methadone, urine          Cutoff 300 ng/mL  The urine drug screen provides only a preliminary, unconfirmed  analytical test result and should not be used for non-medical  purposes. Clinical consideration and professional judgment should  be applied to any positive drug screen result due to possible  interfering substances. A more specific alternate chemical method  must be used in order to obtain a confirmed analytical result.  Gas chromatography / mass spectrometry (GC/MS) is the preferred  confirmatory method.  Performed at [facility], [address] [place],  [place] [address]   Resulting Agency  [facility]      Specimen Collected: 02/03/20 16:14 Last Resulted: 02/03/20 16:47       NORMAL, appropriately positive for opioid as patient is on hydrocodone for chronic pain syndrome.  Summary  Date Value Ref Range Status  11/25/2018 FINAL  Final    Comment:    ==================================================================== TOXASSURE COMP DRUG ANALYSIS,UR ==================================================================== Test                             Result       Flag       Units Drug Present and Declared for Prescription Verification   Hydrocodone                    35           EXPECTED   ng/mg creat  Sources of hydrocodone include scheduled prescription    medications.   Gabapentin                     PRESENT      EXPECTED   Paroxetine                     PRESENT      EXPECTED   Trazodone                      PRESENT      EXPECTED   1,3 chlorophenyl piperazine    PRESENT      EXPECTED    1,3-chlorophenyl piperazine is an expected metabolite of    trazodone.   Acetaminophen                  PRESENT      EXPECTED Drug Present not Declared for Prescription Verification   Cyclobenzaprine                PRESENT      UNEXPECTED   Desmethylcyclobenzaprine       PRESENT      UNEXPECTED    Desmethylcyclobenzaprine is an expected metabolite  of    cyclobenzaprine.   Naproxen                       PRESENT      UNEXPECTED   Diphenhydramine                PRESENT      UNEXPECTED ==================================================================== Test                      Result    Flag   Units      Ref Range   Creatinine              145              mg/dL      >=20 ==================================================================== Declared Medications:  The flagging and interpretation on this report are based on the  following declared medications.  Unexpected results may arise from  inaccuracies in the declared medications.  **Note: The testing scope of this panel includes these medications:  Gabapentin  Hydrocodone (Hydrocodone-Acetaminophen)  Paroxetine  Trazodone  **Note: The testing scope of this panel does not include small to  moderate amounts of these reported medications:  Acetaminophen (Hydrocodone-Acetaminophen)  **Note: The testing scope of this panel does not include following  reported medications:  Albuterol  Amlodipine  Losartan  Pantoprazole ==================================================================== For clinical consultation, please call [phone]. ====================================================================    Laboratory Chemistry Profile   Renal Lab Results  Component Value Date   BUN 12 01/23/2019   CREATININE 0.94 (H) 01/23/2019   BCR 13 01/23/2019   GFRAA 70 01/23/2019   GFRNONAA 60 01/23/2019    Hepatic Lab Results  Component Value Date   AST 25 01/23/2019   ALT 15 01/23/2019    Electrolytes Lab Results  Component Value Date   NA 139 01/23/2019   K 4.3 01/23/2019   CL 102 01/23/2019   CALCIUM 9.4 01/23/2019    Bone Lab Results  Component Value Date   VD25OH 10 (L) 01/23/2019    Inflammation (CRP: Acute Phase) (ESR: Chronic Phase) No results found for: CRP, ESRSEDRATE, LATICACIDVEN    Note: Above Lab results reviewed.  Imaging  No image results  found.  Assessment  The primary encounter diagnosis was Chronic pain of both upper extremities. Diagnoses of Chronic pain syndrome, History of shoulder surgery, Myofascial pain syndrome, Cervical spondylosis, Arthropathy of right shoulder, H/O total shoulder replacement, right, and Long term current use of opiate analgesic were also pertinent to this visit.  Plan of Care  Ms. Bailey Faiella has a current medication list which includes the following long-term medication(s): albuterol, amlodipine, calcium-vitamin d, gabapentin, losartan, pantoprazole, paroxetine, and trazodone.  Continue gabapentin as prescribed. Pharmacotherapy (Medications Ordered): Meds ordered this encounter  Medications  . HYDROcodone-acetaminophen (NORCO) 10-325 MG tablet    Sig: Take 1 tablet by mouth every 8 (eight) hours as needed for severe pain.    Dispense:  90 tablet    Refill:  0    Do not place this medication, or any other prescription from our practice, on "Automatic Refill". Patient may have prescription filled one day early if pharmacy is closed on scheduled refill date.  Marland Kitchen HYDROcodone-acetaminophen (NORCO) 10-325 MG tablet    Sig: Take 1 tablet by mouth every 8 (eight) hours as needed for severe pain.    Dispense:  90 tablet    Refill:  0    Do not place this medication, or any other prescription from our practice, on "Automatic Refill". Patient may have prescription filled one day early if pharmacy is closed on scheduled refill date.  Marland Kitchen HYDROcodone-acetaminophen (NORCO) 10-325 MG tablet    Sig: Take 1 tablet by mouth every 8 (eight) hours as needed for severe pain.    Dispense:  90 tablet    Refill:  0    Do not place this medication, or any other prescription from our practice, on "Automatic Refill". Patient may have prescription filled one day early if pharmacy is closed on scheduled refill date.   Follow-up plan:   Return in about 3 months (around 05/10/2020) for Medication Management.     Future  considerations Cervical facet joint syndrome: Diagnostic cervical facet medial branch nerve blocks with possible cervical radiofrequency ablation Right shoulder arthropathy and degenerative disease status post 5 right shoulder surgeries: Right suprascapular nerve block.      Recent Visits Date Type Provider Dept  11/17/19 Office Visit Gillis Santa, MD [facility]  Showing recent visits within past 90 days and meeting all other requirements   Today's Visits Date Type Provider Dept  02/11/20 Office Visit Gillis Santa, MD [facility]  Showing today's visits and meeting all other requirements   Future Appointments No visits were found meeting these conditions.  Showing future appointments within next 90 days and meeting all other requirements   I discussed the assessment and treatment plan with the patient. The patient was provided an opportunity to ask questions and all were answered. The patient agreed with the plan and demonstrated an understanding of the instructions.  Patient advised to call back or seek an in-person evaluation if the symptoms or condition worsens.  Duration of encounter: 25 minutes.  Note by: Gillis Santa, MD Date: 02/11/2020; Time: 9:46 AM

## 2020-02-24 ENCOUNTER — Other Ambulatory Visit: Payer: Self-pay | Admitting: Family Medicine

## 2020-02-24 DIAGNOSIS — Z9889 Other specified postprocedural states: Secondary | ICD-10-CM

## 2020-02-24 DIAGNOSIS — G894 Chronic pain syndrome: Secondary | ICD-10-CM

## 2020-03-21 ENCOUNTER — Ambulatory Visit: Payer: Medicare Other | Admitting: Family Medicine

## 2020-03-25 ENCOUNTER — Other Ambulatory Visit: Payer: Self-pay | Admitting: Family Medicine

## 2020-03-25 DIAGNOSIS — I10 Essential (primary) hypertension: Secondary | ICD-10-CM

## 2020-03-25 DIAGNOSIS — F339 Major depressive disorder, recurrent, unspecified: Secondary | ICD-10-CM

## 2020-03-27 ENCOUNTER — Other Ambulatory Visit: Payer: Self-pay | Admitting: Family Medicine

## 2020-03-27 DIAGNOSIS — I10 Essential (primary) hypertension: Secondary | ICD-10-CM

## 2020-03-27 NOTE — Telephone Encounter (Signed)
Requested Prescriptions  Pending Prescriptions Disp Refills  . losartan (COZAAR) 100 MG tablet [Pharmacy Med Name: LOSARTAN POTASSIUM 100 MG TAB] 90 tablet 0    Sig: TAKE 1 TABLET BY MOUTH EVERY DAY     Cardiovascular:  Angiotensin Receptor Blockers Failed - 03/27/2020  9:43 AM      Failed - Cr in normal range and within 180 days    Creat  Date Value Ref Range Status  01/23/2019 0.94 (H) 0.60 - 0.93 mg/dL Final    Comment:    For patients >75 years of age, the reference limit for Creatinine is approximately 13% higher for people identified as African-American. .          Failed - K in normal range and within 180 days    Potassium  Date Value Ref Range Status  01/23/2019 4.3 3.5 - 5.3 mmol/L Final         Passed - Patient is not pregnant      Passed - Last BP in normal range    BP Readings from Last 1 Encounters:  10/27/19 (!) 132/57         Passed - Valid encounter within last 6 months    Recent Outpatient Visits          5 months ago Major depression, recurrent, chronic (HCC)   [facility] Alba Cory, MD   11 months ago Mucopurulent chronic bronchitis [facility])   [facility] Alba Cory, MD   1 year ago Mucopurulent chronic bronchitis [facility])   [facility] Alba Cory, MD   1 year ago Mucopurulent chronic bronchitis [facility])   [facility] Alba Cory, MD      Future Appointments            In 4 months [facility]

## 2020-04-19 ENCOUNTER — Other Ambulatory Visit: Payer: Self-pay | Admitting: Family Medicine

## 2020-04-19 DIAGNOSIS — F5104 Psychophysiologic insomnia: Secondary | ICD-10-CM

## 2020-04-19 NOTE — Telephone Encounter (Signed)
Requested Prescriptions  Pending Prescriptions Disp Refills  . traZODone (DESYREL) 50 MG tablet [Pharmacy Med Name: TRAZODONE 50 MG TABLET] 90 tablet 1    Sig: TAKE 1 TABLET (50 MG TOTAL) BY MOUTH AT BEDTIME AS NEEDED FOR SLEEP.     Psychiatry: Antidepressants - Serotonin Modulator Passed - 04/19/2020  1:39 AM      Passed - Completed PHQ-2 or PHQ-9 in the last 360 days.      Passed - Valid encounter within last 6 months    Recent Outpatient Visits          5 months ago Major depression, recurrent, chronic [facility])   [facility] Alba Cory, MD   12 months ago Mucopurulent chronic bronchitis [facility])   [facility] Alba Cory, MD   1 year ago Mucopurulent chronic bronchitis [facility])   [facility] Alba Cory, MD   1 year ago Mucopurulent chronic bronchitis [facility])   [facility] Alba Cory, MD      Future Appointments            In 2 weeks Edward Jolly, MD [facility]   In 3 months  [facility] [place]

## 2020-05-04 ENCOUNTER — Encounter: Payer: Self-pay | Admitting: Student in an Organized Health Care Education/Training Program

## 2020-05-04 ENCOUNTER — Telehealth: Payer: Self-pay

## 2020-05-05 ENCOUNTER — Other Ambulatory Visit: Payer: Self-pay

## 2020-05-05 ENCOUNTER — Ambulatory Visit
Payer: Medicare Other | Attending: Student in an Organized Health Care Education/Training Program | Admitting: Student in an Organized Health Care Education/Training Program

## 2020-05-05 ENCOUNTER — Encounter: Payer: Self-pay | Admitting: Student in an Organized Health Care Education/Training Program

## 2020-05-05 DIAGNOSIS — G894 Chronic pain syndrome: Secondary | ICD-10-CM | POA: Diagnosis not present

## 2020-05-05 MED ORDER — HYDROCODONE-ACETAMINOPHEN 10-325 MG PO TABS: 1.0000 | ORAL_TABLET | Freq: Three times a day (TID) | ORAL | 0 refills | Status: DC | PRN

## 2020-05-05 NOTE — Progress Notes (Signed)
Patient: Natalie Lloyd  Service Category: E/M  Provider: Gillis Santa, MD  DOB: 12-03-1946  DOS: 05/05/2020  Location: Office  MRN: [id number]  Setting: Ambulatory outpatient  Referring Provider: Steele Sizer, MD  Type: Established Patient  Specialty: Interventional Pain Management  PCP: Steele Sizer, MD  Location: Home  Delivery: TeleHealth     Virtual Encounter - Pain Management PROVIDER NOTE: Information contained herein reflects review and annotations entered in association with encounter. Interpretation of such information and data should be left to medically-trained personnel. Information provided to patient can be located elsewhere in the medical record under "Patient Instructions". Document created using STT-dictation technology, any transcriptional errors that may result from process are unintentional.    Contact & Pharmacy Preferred: [phone] Home: [phone] (home) Mobile: [phone] (mobile) E-mail: [email or web address]  [id number] [place] [address]: [phone]: [phone] [id number] [place]: [phone]: [phone] Pre-screening  Ms. WrJoya Gaskinsffered "in-person" vs "virtual" encounter. She indicated preferring virtual for this encounter.   Reason COVID-19*  Social distancing based on CDC and AMA recommendations.   I contacted CaLamonte Richern 05/05/2020 via video conference.      I clearly identified myself as BiGillis SantaMD. I verified that I was speaking with the correct person using two identifiers (Name: CaJasmene Goswamiand date of birth: [birth year]  Consent I sought verbal advanced consent from CaLamonte Richeror virtual visit interactions. I informed Ms. WrHedgepethf possible security and privacy concerns, risks, and limitations associated with providing "not-in-person" medical evaluation and  management services. I also informed Ms. WrSparkmanf the availability of "in-person" appointments. Finally, I informed her that there would be a charge for the virtual visit and that she could be  personally, fully or partially, financially responsible for it. Ms. WrCarrellxpressed understanding and agreed to proceed.   Historic Elements   Ms. CaDonyel Castagnolas a [age].o. year old, female patient evaluated today after her last contact with our practice on 05/04/2020. Ms. WrStauderhas a past medical history of Anxiety, Arthritis, COPD (chronic obstructive pulmonary disease) ([place] DDD (degenerative disc disease), lumbar, Depression, Generalized OA, GERD (gastroesophageal reflux disease), Hypertension, Osteoporosis, Sleep apnea, and Substance abuse ([place] She also  has a past surgical history that includes Rotator cuff repair; Appendectomy; Cervical spine surgery; and Carpal tunnel release. Ms. WrVokesas a current medication list which includes the following prescription(s): albuterol, amlodipine, calcium-vitamin d, vitamin d, gabapentin, [START ON 05/13/2020] hydrocodone-acetaminophen, [START ON 06/12/2020] hydrocodone-acetaminophen, [START ON 07/12/2020] hydrocodone-acetaminophen, losartan, pantoprazole, paroxetine, and trazodone. She  reports that she has been smoking cigarettes. She has a 12.50 pack-year smoking history. She has never used smokeless tobacco. She reports current alcohol use. She reports previous drug use. Drug: IV. Ms. WrVasudevanas No Known Allergies.   HPI  Today, she is being contacted for medication management.   No change in medical history since last visit.  Patient's pain is at baseline.  Patient continues multimodal pain regimen as prescribed.  States that it provides pain relief and improvement in functional status. [place] and wedding that she is excited about.  Pharmacotherapy Assessment  Analgesic: 04/14/2020  2   02/11/2020  Hydrocodone-Acetamin  10-325 MG  90.00  30 Bi Lat   [id number] Nor (1409)   0  30.00 MME  Medicare   [place]     Monitoring: [place] PMP: PDMP reviewed during this encounter.       Pharmacotherapy: No side-effects or adverse reactions reported. Compliance: No problems identified. Effectiveness: Clinically acceptable. Plan: Refer to "POC".  UDS:  Summary  Date Value Ref Range Status  11/25/2018 FINAL  Final    Comment:    ==================================================================== TOXASSURE COMP DRUG ANALYSIS,UR ==================================================================== Test                             Result       Flag       Units Drug Present and Declared for Prescription Verification   Hydrocodone                    35           EXPECTED   ng/mg creat    Sources of hydrocodone include scheduled prescription    medications.   Gabapentin                     PRESENT      EXPECTED   Paroxetine                     PRESENT      EXPECTED   Trazodone                      PRESENT      EXPECTED   1,3 chlorophenyl piperazine    PRESENT      EXPECTED    1,3-chlorophenyl piperazine is an expected metabolite of    trazodone.   Acetaminophen                  PRESENT      EXPECTED Drug Present not Declared for Prescription Verification   Cyclobenzaprine                PRESENT      UNEXPECTED   Desmethylcyclobenzaprine       PRESENT      UNEXPECTED    Desmethylcyclobenzaprine is an expected metabolite of    cyclobenzaprine.   Naproxen                       PRESENT      UNEXPECTED   Diphenhydramine                PRESENT      UNEXPECTED ==================================================================== Test                      Result    Flag   Units      Ref Range   Creatinine              145              mg/dL      >=20 ==================================================================== Declared Medications:  The flagging and interpretation on this report are based on the  following declared  medications.  Unexpected results may arise from  inaccuracies in the declared medications.  **Note: The testing scope of this panel includes these medications:  Gabapentin  Hydrocodone (Hydrocodone-Acetaminophen)  Paroxetine  Trazodone  **Note: The testing scope of this panel does not include small to  moderate amounts of these reported medications:  Acetaminophen (Hydrocodone-Acetaminophen)  **Note: The testing scope of this panel does not include following  reported medications:  Albuterol  Amlodipine  Losartan  Pantoprazole ==================================================================== For clinical consultation, please call [phone]. ====================================================================    Laboratory Chemistry Profile   Renal Lab Results  Component Value Date   BUN 12 01/23/2019   CREATININE 0.94 (H) 01/23/2019   BCR 13 01/23/2019   GFRAA 70 01/23/2019   GFRNONAA 60 01/23/2019     Hepatic Lab Results  Component Value Date   AST 25 01/23/2019   ALT 15 01/23/2019     Electrolytes Lab Results  Component Value Date   NA 139 01/23/2019   K 4.3 01/23/2019   CL 102 01/23/2019   CALCIUM 9.4 01/23/2019     Bone Lab Results  Component Value Date   VD25OH 10 (L) 01/23/2019     Inflammation (CRP: Acute Phase) (ESR: Chronic Phase) No results found for: CRP, ESRSEDRATE, LATICACIDVEN     Note: Above Lab results reviewed.  Imaging  No image results found.  Assessment  The encounter diagnosis was Chronic pain syndrome.  Plan of Care  Ms. Kahlyn Shippey has a current medication list which includes the following long-term medication(s): albuterol, amlodipine, calcium-vitamin d, gabapentin, losartan, pantoprazole, paroxetine, and trazodone.  Pharmacotherapy (Medications Ordered): Meds ordered this encounter  Medications  . HYDROcodone-acetaminophen (NORCO) 10-325 MG tablet    Sig: Take 1 tablet by mouth every 8 (eight) hours as needed  for severe pain.    Dispense:  90 tablet    Refill:  0    Do not place this medication, or any other prescription from our practice, on "Automatic Refill". Patient may have prescription filled one day early if pharmacy is closed on scheduled refill date.  Marland Kitchen HYDROcodone-acetaminophen (NORCO) 10-325 MG tablet    Sig: Take 1 tablet by mouth every 8 (eight) hours as needed for severe pain.    Dispense:  90 tablet    Refill:  0    Do not place this medication, or any other prescription from our practice, on "Automatic Refill". Patient may have prescription filled one day early if pharmacy is closed on scheduled refill date.  Marland Kitchen HYDROcodone-acetaminophen (NORCO) 10-325 MG tablet    Sig: Take 1 tablet by mouth every 8 (eight) hours as needed for severe pain.    Dispense:  90 tablet    Refill:  0    Do not place this medication, or any other prescription from our practice, on "Automatic Refill". Patient may have prescription filled one day early if pharmacy is closed on scheduled refill date.   Orders:  No orders of the defined types were placed in this encounter.  Follow-up plan:   Return in about 3 months (around 08/05/2020) for Medication Management, in person.     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       Recent Visits Date Type Provider Dept  02/11/20 Office Visit Gillis Santa, MD [facility]  Showing recent visits within past 90 days and meeting all other requirements   Today's Visits Date Type Provider Dept  05/05/20 Telemedicine Gillis Santa, MD [facility]  Showing today's visits and meeting all other requirements   Future Appointments No visits were found meeting these conditions.  Showing future appointments within next  90 days and meeting all other requirements   I discussed the assessment and treatment plan with the patient. The patient was provided an opportunity  to ask questions and all were answered. The patient agreed with the plan and demonstrated an understanding of the instructions.  Patient advised to call back or seek an in-person evaluation if the symptoms or condition worsens.  Duration of encounter: 25 minutes.  Note by: Gillis Santa, MD Date: 05/05/2020; Time: 8:58 AM

## 2020-05-13 DIAGNOSIS — Z23 Encounter for immunization: Secondary | ICD-10-CM | POA: Diagnosis not present

## 2020-06-19 ENCOUNTER — Other Ambulatory Visit: Payer: Self-pay | Admitting: Family Medicine

## 2020-06-19 DIAGNOSIS — F339 Major depressive disorder, recurrent, unspecified: Secondary | ICD-10-CM

## 2020-06-19 DIAGNOSIS — I10 Essential (primary) hypertension: Secondary | ICD-10-CM

## 2020-06-19 NOTE — Telephone Encounter (Signed)
Requested Prescriptions  Pending Prescriptions Disp Refills   amLODipine (NORVASC) 5 MG tablet [Pharmacy Med Name: AMLODIPINE BESYLATE 5 MG TAB] 30 tablet 0    Sig: TAKE 1 TABLET BY MOUTH EVERY DAY     Cardiovascular:  Calcium Channel Blockers Failed - 06/19/2020  9:19 AM      Failed - Valid encounter within last 6 months    Recent Outpatient Visits          7 months ago Major depression, recurrent, chronic (HCC)   [facility] Cory, MD   1 year ago Mucopurulent chronic bronchitis [facility])   [facility] Cory, MD   1 year ago Mucopurulent chronic bronchitis [facility])   [facility] Alba Cory, MD   1 year ago Mucopurulent chronic bronchitis [facility])   [facility] [place], Danna Hefty, MD      Future Appointments            In 1 month [facility]            Passed - Last BP in normal range    BP Readings from Last 1 Encounters:  10/27/19 (!) 132/57          PARoxetine (PAXIL) 40 MG tablet [Pharmacy Med Name: PAROXETINE HCL 40 MG TABLET] 90 tablet 0    Sig: TAKE 1 TAB BY MOUTH EVERY MORNING     Psychiatry:  Antidepressants - SSRI Failed - 06/19/2020  9:19 AM      Failed - Valid encounter within last 6 months    Recent Outpatient Visits          7 months ago Major depression, recurrent, chronic (HCC)   [facility] Cory, MD   1 year ago Mucopurulent chronic bronchitis [facility])   [facility] Cory, MD   1 year ago Mucopurulent chronic bronchitis [facility])   [facility] Alba Cory, MD   1 year ago Mucopurulent chronic bronch[facility])   [facility] Alba Cory, MD      Future Appointments            In 1 month [facility]            Passed - Completed PHQ-2 or PHQ-9 in the last 360 days.       losartan (COZAAR) 100 MG tablet  [Pharmacy Med Name: LOSARTAN POTASSIUM 100 MG TAB] 90 tablet 0    Sig: TAKE 1 TABLET BY MOUTH EVERY DAY     Cardiovascular:  Angiotensin Receptor Blockers Failed - 06/19/2020  9:19 AM      Failed - Cr in normal range and within 180 days    Creat  Date Value Ref Range Status  01/23/2019 0.94 (H) 0.60 - 0.93 mg/dL Final    Comment:    For patients >43 years of age, the reference limit for Creatinine is approximately 13% higher for people identified as African-American. .          Failed - K in normal range and within 180 days    Potassium  Date Value Ref Range Status  01/23/2019 4.3 3.5 - 5.3 mmol/L Final         Failed - Valid encounter within last 6 months    Recent Outpatient Visits          7 months ago Major depression, recurrent, chronic [facility])   [facility], [place],  MD   1 year ago Mucopurulent chronic bronchitis [facility])   [facility] Alba Cory, MD   1 year ago Mucopurulent chronic bronchitis [facility])   [facility] Alba Cory, MD   1 year ago Mucopurulent chronic bronchitis [facility])   [facility] Alba Cory, MD      Future Appointments            In 1 month [facility]            Passed - Patient is not pregnant      Passed - Last BP in normal range    BP Readings from Last 1 Encounters:  10/27/19 (!) 132/57         30 day courtesy refill/ask pt to call and make appt for further refills

## 2020-06-23 ENCOUNTER — Other Ambulatory Visit: Payer: Self-pay | Admitting: Family Medicine

## 2020-06-23 DIAGNOSIS — K219 Gastro-esophageal reflux disease without esophagitis: Secondary | ICD-10-CM

## 2020-07-19 ENCOUNTER — Other Ambulatory Visit: Payer: Self-pay | Admitting: Family Medicine

## 2020-07-19 DIAGNOSIS — I10 Essential (primary) hypertension: Secondary | ICD-10-CM

## 2020-07-19 DIAGNOSIS — F5104 Psychophysiologic insomnia: Secondary | ICD-10-CM

## 2020-07-19 NOTE — Telephone Encounter (Signed)
30 day courtesy refill future appt. 07/26/20

## 2020-07-19 NOTE — Telephone Encounter (Signed)
Requested medication (s) are due for refill today: yes  Requested medication (s) are on the active medication list: yes  Last refill:  04/22/2020  Future visit scheduled: no  Notes to clinic:  Patient overdue for 6 month follow up     Requested Prescriptions  Pending Prescriptions Disp Refills   traZODone (DESYREL) 50 MG tablet [Pharmacy Med Name: TRAZODONE 50 MG TABLET] 90 tablet 0    Sig: TAKE 1 TABLET (50 MG TOTAL) BY MOUTH AT BEDTIME AS NEEDED FOR SLEEP.      Psychiatry: Antidepressants - Serotonin Modulator Failed - 07/19/2020  1:33 AM      Failed - Valid encounter within last 6 months    Recent Outpatient Visits           8 months ago Major depression, recurrent, chronic [facility])   [facility] Cory, MD   1 year ago Mucopurulent chronic bronchitis [facility])   [facility] Alba Cory, MD   1 year ago Mucopurulent chronic bronchitis [facility])   [facility] Al[facility] Cory, MD   1 year ago Mucopurulent chronic bronchitis [facility])   [facility] Alba Cory, MD       Future Appointments             In 1 week [facility]             Passed - Completed PHQ-2 or PHQ-9 in the last 360 days.

## 2020-07-20 ENCOUNTER — Other Ambulatory Visit: Payer: Self-pay | Admitting: Family Medicine

## 2020-07-20 DIAGNOSIS — F339 Major depressive disorder, recurrent, unspecified: Secondary | ICD-10-CM

## 2020-07-20 DIAGNOSIS — I10 Essential (primary) hypertension: Secondary | ICD-10-CM

## 2020-07-26 ENCOUNTER — Telehealth: Payer: Self-pay | Admitting: Family Medicine

## 2020-07-26 ENCOUNTER — Ambulatory Visit: Payer: Medicare Other

## 2020-07-26 NOTE — Telephone Encounter (Signed)
Copied from CRM [id number]. Topic: Medicare AWV >> Jul 26, 2020  8:11 AM Claudette Laws R wrote: Reason for CRM:  Lmtcb to r/s AWVS due to [facility] out of the office today-srs

## 2020-07-28 ENCOUNTER — Ambulatory Visit: Payer: Medicare Other

## 2020-08-02 ENCOUNTER — Ambulatory Visit
Payer: Medicare Other | Attending: Student in an Organized Health Care Education/Training Program | Admitting: Student in an Organized Health Care Education/Training Program

## 2020-08-02 ENCOUNTER — Encounter: Payer: Self-pay | Admitting: Student in an Organized Health Care Education/Training Program

## 2020-08-02 ENCOUNTER — Other Ambulatory Visit: Payer: Self-pay

## 2020-08-02 VITALS — BP 90/61 | HR 59 | Temp 97.0°F | Resp 18 | Ht 68.0 in | Wt 171.0 lb

## 2020-08-02 DIAGNOSIS — M19011 Primary osteoarthritis, right shoulder: Secondary | ICD-10-CM | POA: Diagnosis not present

## 2020-08-02 DIAGNOSIS — Z79891 Long term (current) use of opiate analgesic: Secondary | ICD-10-CM | POA: Diagnosis not present

## 2020-08-02 DIAGNOSIS — M79602 Pain in left arm: Secondary | ICD-10-CM | POA: Diagnosis not present

## 2020-08-02 DIAGNOSIS — Z96611 Presence of right artificial shoulder joint: Secondary | ICD-10-CM | POA: Diagnosis not present

## 2020-08-02 DIAGNOSIS — M47812 Spondylosis without myelopathy or radiculopathy, cervical region: Secondary | ICD-10-CM | POA: Diagnosis not present

## 2020-08-02 DIAGNOSIS — M67912 Unspecified disorder of synovium and tendon, left shoulder: Secondary | ICD-10-CM | POA: Diagnosis not present

## 2020-08-02 DIAGNOSIS — Z9889 Other specified postprocedural states: Secondary | ICD-10-CM | POA: Insufficient documentation

## 2020-08-02 DIAGNOSIS — M25512 Pain in left shoulder: Secondary | ICD-10-CM | POA: Insufficient documentation

## 2020-08-02 DIAGNOSIS — G894 Chronic pain syndrome: Secondary | ICD-10-CM | POA: Diagnosis not present

## 2020-08-02 DIAGNOSIS — M19012 Primary osteoarthritis, left shoulder: Secondary | ICD-10-CM | POA: Diagnosis not present

## 2020-08-02 DIAGNOSIS — M67921 Unspecified disorder of synovium and tendon, right upper arm: Secondary | ICD-10-CM | POA: Diagnosis not present

## 2020-08-02 DIAGNOSIS — M79601 Pain in right arm: Secondary | ICD-10-CM | POA: Insufficient documentation

## 2020-08-02 DIAGNOSIS — G8929 Other chronic pain: Secondary | ICD-10-CM | POA: Diagnosis not present

## 2020-08-02 DIAGNOSIS — M7918 Myalgia, other site: Secondary | ICD-10-CM | POA: Diagnosis not present

## 2020-08-02 HISTORY — DX: Myalgia, other site: M79.18

## 2020-08-02 MED ORDER — HYDROCODONE-ACETAMINOPHEN 10-325 MG PO TABS: 1.0000 | ORAL_TABLET | Freq: Three times a day (TID) | ORAL | 0 refills | Status: DC | PRN

## 2020-08-02 NOTE — Progress Notes (Signed)
PROVIDER NOTE: Information contained herein reflects review and annotations entered in association with encounter. Interpretation of such information and data should be left to medically-trained personnel. Information provided to patient can be located elsewhere in the medical record under "Patient Instructions". Document created using STT-dictation technology, any transcriptional errors that may result from process are unintentional.    Patient: Natalie Lloyd  Service Category: E/M  Provider: Gillis Santa, MD  DOB: 03/07/1946  DOS: 08/02/2020  Specialty: Interventional Pain Management  MRN: [id number]  Setting: Ambulatory outpatient  PCP: Steele Sizer, MD  Type: Established Patient    Referring Provider: Steele Sizer, MD  Location: Office  Delivery: Face-to-face     HPI  Reason for encounter: [name], a 74 y.o. year old female, is here today for evaluation and management of her Chronic left shoulder pain [M25.512, G89.29]. [name] primary complain today is Shoulder Pain (bilateral) Last encounter: Practice (05/04/2020). My last encounter with her was on 02/11/2020. Pertinent problems: [name] has DDD (degenerative disc disease), lumbar; H/O cervical spine surgery; Pain in joint of right shoulder; Arthropathy of right shoulder; Chronic left shoulder pain; Cervical spondylosis; Long term current use of opiate analgesic; and Chronic upper extremity pain (R>L) on their pertinent problem list. Pain Assessment: Severity of Chronic pain is reported as a 6 /10. Location: Shoulder Right, Left/right shoulder radiates down right arm. Onset: More than a month ago. Quality: Sharp, Tightness (feels like it is locking). Timing: Constant. Modifying factor(s): medications. Vitals:  height is '5\' 8"'  (1.727 m) and weight is 171 lb (77.6 kg). Her temperature is 97 F (36.1 C) (abnormal). Her blood pressure is 90/61 and her pulse is 59 (abnormal). Her respiration is 18 and oxygen saturation is 97%.    Patient follows up today for medication management and worsening left shoulder pain.  History of left shoulder osteoarthritis and has been told that she needs left shoulder replacement surgery but she does not want to pursue this.  She did not have a good experience with her right shoulder surgery.  She has had shoulder steroid injections in the past which were not effective.  We did discuss sprint peripheral nerve stimulation of left axillary nerve for her shoulder pain.  I provided the patient resources regarding this therapy.  Other wise refill hydrocodone as below.  PMP appropriate, UDS up-to-date and appropriate.  Pharmacotherapy Assessment   07/12/2020  2   05/05/2020  Hydrocodone-Acetamin 10-325 MG  90.00  30 Bi Lat   [id number]   Nor ([id number])   0/0  30.00 MME  Medicare   [place]      Monitoring: [place] PMP: PDMP reviewed during this encounter.       Pharmacotherapy: No side-effects or adverse reactions reported. Compliance: No problems identified. Effectiveness: Clinically acceptable.  Dewayne Shorter, RN  08/02/2020  9:42 AM  Signed Nursing Pain Medication Assessment:  Safety precautions to be maintained throughout the outpatient stay will include: orient to surroundings, keep bed in low position, maintain call bell within reach at all times, provide assistance with transfer out of bed and ambulation.  Medication Inspection Compliance: Ms. Palladino did not comply with our request to bring her pills to be counted. She was reminded that bringing the medication bottles, even when empty, is a requirement.  Medication: None brought in. Pill/Patch Count: None available to be counted. Bottle Appearance: No container available. Did not bring bottle(s) to appointment. Filled Date: N/A Last Medication intake:  Yesterday  Reminded to bring medication/ empty bottles to  appointments    UDS:  Summary  Date Value Ref Range Status  11/25/2018 FINAL  Final    Comment:     ==================================================================== [facility] COMP DRUG ANALYSIS,UR ==================================================================== Test                             Result       Flag       Units Drug Present and Declared for Prescription Verification   Hydrocodone                    35           EXPECTED   ng/mg creat    Sources of hydrocodone include scheduled prescription    medications.   Gabapentin                     PRESENT      EXPECTED   Paroxetine                     PRESENT      EXPECTED   Trazodone                      PRESENT      EXPECTED   1,3 chlorophenyl piperazine    PRESENT      EXPECTED    1,3-chlorophenyl piperazine is an expected metabolite of    trazodone.   Acetaminophen                  PRESENT      EXPECTED Drug Present not Declared for Prescription Verification   Cyclobenzaprine                PRESENT      UNEXPECTED   Desmethylcyclobenzaprine       PRESENT      UNEXPECTED    Desmethylcyclobenzaprine is an expected metabolite of    cyclobenzaprine.   Naproxen                       PRESENT      UNEXPECTED   Diphenhydramine                PRESENT      UNEXPECTED ==================================================================== Test                      Result    Flag   Units      Ref Range   Creatinine              145              mg/dL      >=20 ==================================================================== Declared Medications:  The flagging and interpretation on this report are based on the  following declared medications.  Unexpected results may arise from  inaccuracies in the declared medications.  **Note: The testing scope of this panel includes these medications:  Gabapentin  Hydrocodone (Hydrocodone-Acetaminophen)  Paroxetine  Trazodone  **Note: The testing scope of this panel does not include small to  moderate amounts of these reported medications:  Acetaminophen (Hydrocodone-Acetaminophen)   **Note: The testing scope of this panel does not include following  reported medications:  Albuterol  Amlodipine  Losartan  Pantoprazole ==================================================================== For clinical consultation, please call [phone]. ====================================================================       0 Result Notes  Ref Range & Units 6 mo ago  Tricyclic,  Ur Screen NONE DETECTED NONE DETECTED   Amphetamines, Ur Screen NONE DETECTED NONE DETECTED   MDMA (Ecstasy)Ur Screen NONE DETECTED NONE DETECTED   Cocaine Metabolite,Ur [place] NONE DETECTED NONE DETECTED   Opiate, Ur Screen NONE DETECTED POSITIVEAbnormal   Phencyclidine (PCP) Ur S NONE DETECTED NONE DETECTED   Cannabinoid 50 Ng, Ur [place] NONE DETECTED NONE DETECTED   Barbiturates, Ur Screen NONE DETECTED NONE DETECTED   Benzodiazepine, Ur Scrn NONE DETECTED NONE DETECTED          ROS  Constitutional: Denies any fever or chills Gastrointestinal: No reported hemesis, hematochezia, vomiting, or acute GI distress Musculoskeletal: Bilateral shoulder pain worse on the left due to over compensation Neurological: No reported episodes of acute onset apraxia, aphasia, dysarthria, agnosia, amnesia, paralysis, loss of coordination, or loss of consciousness  Medication Review  HYDROcodone-acetaminophen, PARoxetine, Vitamin D, albuterol, amLODipine, calcium-vitamin D, gabapentin, losartan, pantoprazole, and traZODone  History Review  Allergy: [name] has No Known Allergies. Drug: [name]  reports previous drug use. Drug: IV. Alcohol:  reports current alcohol use. Tobacco:  reports that she has been smoking cigarettes. She has a 12.50 pack-year smoking history. She has never used smokeless tobacco. Social: [name]  reports that she has been smoking cigarettes. She has a 12.50 pack-year smoking history. She has never used smokeless tobacco. She reports current alcohol use. She reports previous drug  use. Drug: IV. Medical:  has a past medical history of Anxiety, Arthritis, COPD (chronic obstructive pulmonary disease) ([place]), DDD (degenerative disc disease), lumbar, Depression, Generalized OA, GERD (gastroesophageal reflux disease), Hypertension, Myofascial pain syndrome (08/02/2020), Osteoporosis, Sleep apnea, and Substance abuse ([place]). Surgical: [name]  has a past surgical history that includes Rotator cuff repair; Appendectomy; Cervical spine surgery; and Carpal tunnel release. Family: family history is not on file.  Laboratory Chemistry Profile   Renal Lab Results  Component Value Date   BUN 12 01/23/2019   CREATININE 0.94 (H) 01/23/2019   BCR 13 01/23/2019   GFRAA 70 01/23/2019   GFRNONAA 60 01/23/2019     Hepatic Lab Results  Component Value Date   AST 25 01/23/2019   ALT 15 01/23/2019     Electrolytes Lab Results  Component Value Date   NA 139 01/23/2019   K 4.3 01/23/2019   CL 102 01/23/2019   CALCIUM 9.4 01/23/2019     Bone Lab Results  Component Value Date   VD25OH 10 (L) 01/23/2019     Inflammation (CRP: Acute Phase) (ESR: Chronic Phase) No results found for: CRP, ESRSEDRATE, LATICACIDVEN     Note: Above Lab results reviewed.  Recent Imaging Review  No image results found. Note: Reviewed        Physical Exam  General appearance: Well nourished, well developed, and well hydrated. In no apparent acute distress Mental status: Alert, oriented x 3 (person, place, & time)       Respiratory: No evidence of acute respiratory distress Eyes: PERLA Vitals: BP 90/61   Pulse (!) 59   Temp (!) 97 F (36.1 C)   Resp 18   Ht '5\' 8"'  (1.727 m)   Wt 171 lb (77.6 kg)   SpO2 97%   BMI 26.00 kg/m  BMI: Estimated body mass index is 26 kg/m as calculated from the following:   Height as of this encounter: '5\' 8"'  (1.727 m).   Weight as of this encounter: 171 lb (77.6 kg). Ideal: Ideal body weight: 63.9 kg (140 lb 14 oz) Adjusted ideal body weight: 69.4  kg (152  lb 14.8 oz)  Cervical Spine Area Exam  Skin & Axial Inspection: No masses, redness, edema, swelling, or associated skin lesions Alignment: Symmetrical Functional ROM: Pain restricted ROM      Stability: No instability detected Muscle Tone/Strength: Functionally intact. No obvious neuro-muscular anomalies detected. Sensory (Neurological): Musculoskeletal pain pattern  Upper Extremity (UE) Exam    Side: Right upper extremity  Side: Left upper extremity  Skin & Extremity Inspection: Evidence of prior arthroplastic surgery  Skin & Extremity Inspection: Skin color, temperature, and hair growth are WNL. No peripheral edema or cyanosis. No masses, redness, swelling, asymmetry, or associated skin lesions. No contractures.  Functional ROM: Pain restricted ROM for shoulder  Functional ROM: Pain restricted ROM for shoulder  Muscle Tone/Strength: Functionally intact. No obvious neuro-muscular anomalies detected.  Muscle Tone/Strength: Functionally intact. No obvious neuro-muscular anomalies detected.  Sensory (Neurological): Arthropathic arthralgia          Sensory (Neurological): Articular pain pattern          Palpation: No palpable anomalies              Palpation: No palpable anomalies              Provocative Test(s):  Phalen's test: deferred Tinel's test: deferred Apley's scratch test (touch opposite shoulder):  Action 1 (Across chest): Decreased ROM Action 2 (Overhead): Decreased ROM Action 3 (LB reach): Decreased ROM   Provocative Test(s):  Phalen's test: deferred Tinel's test: deferred Apley's scratch test (touch opposite shoulder):  Action 1 (Across chest): Decreased ROM Action 2 (Overhead): Decreased ROM Action 3 (LB reach): Decreased ROM      Assessment   Status Diagnosis  Persistent Persistent Persistent 1. Chronic left shoulder pain   2. Chronic pain syndrome   3. Dysfunction of left rotator cuff   4. Primary osteoarthritis of left shoulder   5. Chronic pain of both  upper extremities   6. History of shoulder surgery   7. Myofascial pain syndrome   8. Cervical spondylosis   9. Arthropathy of right shoulder   10. H/O total shoulder replacement, right   11. Long term current use of opiate analgesic   12. Disorder of tendon of right biceps   13. H/O cervical spine surgery      Updated Problems: Problem  Long Term Current Use of Opiate Analgesic  Chronic upper extremity pain (R>L)  H/O Cervical Spine Surgery  Pain in Joint of Right Shoulder  Arthropathy of Right Shoulder  Chronic Left Shoulder Pain  Cervical Spondylosis  Ddd (Degenerative Disc Disease), Lumbar  Dysfunction of Left Rotator Cuff  Primary Osteoarthritis of Left Shoulder  History of Shoulder Surgery  Myofascial Pain Syndrome    Plan of Care  [name] has a current medication list which includes the following long-term medication(s): albuterol, amlodipine, calcium-vitamin d, gabapentin, losartan, pantoprazole, paroxetine, and trazodone.  Pharmacotherapy (Medications Ordered): Meds ordered this encounter  Medications  . HYDROcodone-acetaminophen (NORCO) 10-325 MG tablet    Sig: Take 1 tablet by mouth every 8 (eight) hours as needed for severe pain.    Dispense:  90 tablet    Refill:  0    Do not place this medication, or any other prescription from our practice, on "Automatic Refill". Patient may have prescription filled one day early if pharmacy is closed on scheduled refill date.  Marland Kitchen HYDROcodone-acetaminophen (NORCO) 10-325 MG tablet    Sig: Take 1 tablet by mouth every 8 (eight) hours as needed for severe pain.  Dispense:  90 tablet    Refill:  0    Do not place this medication, or any other prescription from our practice, on "Automatic Refill". Patient may have prescription filled one day early if pharmacy is closed on scheduled refill date.  Marland Kitchen HYDROcodone-acetaminophen (NORCO) 10-325 MG tablet    Sig: Take 1 tablet by mouth every 8 (eight) hours as needed for  severe pain.    Dispense:  90 tablet    Refill:  0    Do not place this medication, or any other prescription from our practice, on "Automatic Refill". Patient may have prescription filled one day early if pharmacy is closed on scheduled refill date.   Follow-up plan:   Return in about 3 months (around 11/02/2020) for Medication Management, in person.     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  Consider left Sprint peripheral nerve stimulation of left axillary nerve for left shoulder pain      Recent Visits Date Type Provider Dept  05/05/20 Telemedicine Gillis Santa, MD [facility]  Showing recent visits within past 90 days and meeting all other requirements Today's Visits Date Type Provider Dept  08/02/20 Office Visit Gillis Santa, MD [facility]  Showing today's visits and meeting all other requirements Future Appointments No visits were found meeting these conditions. Showing future appointments within next 90 days and meeting all other requirements  I discussed the assessment and treatment plan with the patient. The patient was provided an opportunity to ask questions and all were answered. The patient agreed with the plan and demonstrated an understanding of the instructions.  Patient advised to call back or seek an in-person evaluation if the symptoms or condition worsens.  Duration of encounter: 30 minutes.  Note by: Gillis Santa, MD Date: 08/02/2020; Time: 10:42 AM

## 2020-08-02 NOTE — Patient Instructions (Signed)
PNS pamphlet given per Dr Cherylann Ratel

## 2020-08-02 NOTE — Progress Notes (Signed)
Nursing Pain Medication Assessment:  Safety precautions to be maintained throughout the outpatient stay will include: orient to surroundings, keep bed in low position, maintain call bell within reach at all times, provide assistance with transfer out of bed and ambulation.  Medication Inspection Compliance: Ms. Rappa did not comply with our request to bring her pills to be counted. She was reminded that bringing the medication bottles, even when empty, is a requirement.  Medication: None brought in. Pill/Patch Count: None available to be counted. Bottle Appearance: No container available. Did not bring bottle(s) to appointment. Filled Date: N/A Last Medication intake:  Yesterday  Reminded to bring medication/ empty bottles to appointments

## 2020-08-11 ENCOUNTER — Ambulatory Visit (INDEPENDENT_AMBULATORY_CARE_PROVIDER_SITE_OTHER): Payer: Medicare Other

## 2020-08-11 VITALS — BP 113/77 | HR 63 | Temp 97.6°F

## 2020-08-11 DIAGNOSIS — Z1231 Encounter for screening mammogram for malignant neoplasm of breast: Secondary | ICD-10-CM | POA: Diagnosis not present

## 2020-08-11 DIAGNOSIS — Z780 Asymptomatic menopausal state: Secondary | ICD-10-CM

## 2020-08-11 DIAGNOSIS — Z0100 Encounter for examination of eyes and vision without abnormal findings: Secondary | ICD-10-CM

## 2020-08-11 DIAGNOSIS — Z0000 Encounter for general adult medical examination without abnormal findings: Secondary | ICD-10-CM | POA: Diagnosis not present

## 2020-08-11 NOTE — Progress Notes (Signed)
Subjective:   Natalie GasserCarolyn Lloyd is a 74 y.o. female who presents for Medicare Annual (Subsequent) preventive examination.  Virtual Visit via Telephone Note  I connected with  Natalie Gasserarolyn Arvizu on 08/11/20 at  2:50 PM EDT by telephone and verified that I am speaking with the correct person using two identifiers.  Medicare Annual Wellness visit completed telephonically due to Covid-19 pandemic.   Location: Patient: home Provider: [facility]   I discussed the limitations, risks, security and privacy concerns of performing an evaluation and management service by telephone and the availability of in person appointments. The patient expressed understanding and agreed to proceed.  Unable to perform video visit due to video visit attempted and failed and/or patient does not have video capability.   Some vital signs may be absent or patient reported.   Reather LittlerKasey Cyndie Woodbeck, LPN    Review of Systems     Cardiac Risk Factors include: advanced age (>2655men, 41>65 women);hypertension;smoking/ tobacco exposure     Objective:    Today's Vitals   08/11/20 1513  BP: 113/77  Pulse: 63  Temp: 97.6 F (36.4 C)  PainSc: 7    There is no height or weight on file to calculate BMI.  Advanced Directives 08/11/2020 08/02/2020 07/24/2019 11/25/2018  Does Patient Have a Medical Advance Directive? Yes Yes No No  Type of Estate agentAdvance Directive Healthcare Power of [place];Living will Healthcare Power of [place];Living will - -  Copy of Healthcare Power of Attorney in Chart? No - copy requested - - -  Would patient like information on creating a medical advance directive? - - Yes (MAU/Ambulatory/Procedural Areas - Information given) No - Patient declined    Current Medications (verified) Outpatient Encounter Medications as of 08/11/2020  Medication Sig  . albuterol (VENTOLIN HFA) 108 (90 Base) MCG/ACT inhaler INHALE 2 PUFFS INTO THE LUNGS EVERY 6 HOURS AS NEEDED FOR WHEEZING OR SHORTNESS OF BREATH  . amLODipine (NORVASC) 5  MG tablet TAKE 1 TABLET BY MOUTH EVERY DAY  . calcium-vitamin D (OSCAL WITH D) 500-200 MG-UNIT tablet Take 1 tablet by mouth.  . Cholecalciferol (VITAMIN D) 50 MCG (2000 UT) CAPS Take 1 capsule (2,000 Units total) by mouth daily.  Marland Kitchen. HYDROcodone-acetaminophen (NORCO) 10-325 MG tablet Take 1 tablet by mouth every 8 (eight) hours as needed for severe pain.  Marland Kitchen. losartan (COZAAR) 100 MG tablet TAKE 1 TABLET BY MOUTH EVERY DAY  . pantoprazole (PROTONIX) 40 MG tablet TAKE 1 TABLET BY MOUTH EVERY DAY  . PARoxetine (PAXIL) 40 MG tablet TAKE 1 TABLET BY MOUTH EVERY DAY IN THE MORNING  . traZODone (DESYREL) 50 MG tablet TAKE 1 TABLET (50 MG TOTAL) BY MOUTH AT BEDTIME AS NEEDED FOR SLEEP.  Marland Kitchen. gabapentin (NEURONTIN) 100 MG capsule TAKE 1 CAPSULE BY MOUTH THREE TIMES A DAY (Patient not taking: Reported on 08/11/2020)  . [START ON 09/10/2020] HYDROcodone-acetaminophen (NORCO) 10-325 MG tablet Take 1 tablet by mouth every 8 (eight) hours as needed for severe pain.  Natalie Lloyd. [START ON 10/10/2020] HYDROcodone-acetaminophen (NORCO) 10-325 MG tablet Take 1 tablet by mouth every 8 (eight) hours as needed for severe pain.   No facility-administered encounter medications on file as of 08/11/2020.    Allergies (verified) Patient has no known allergies.   History: Past Medical History:  Diagnosis Date  . Anxiety   . Arthritis   . COPD (chronic obstructive pulmonary disease) (HCC)   . DDD (degenerative disc disease), lumbar   . Depression   . Generalized OA   . GERD (gastroesophageal reflux disease)   .  Hypertension   . Myofascial pain syndrome 08/02/2020  . Osteoporosis   . Sleep apnea   . Substance abuse [facility])    Past Surgical History:  Procedure Laterality Date  . APPENDECTOMY    . CARPAL TUNNEL RELEASE    . CERVICAL SPINE SURGERY    . ROTATOR CUFF REPAIR     History reviewed. No pertinent family history. Social History   Socioeconomic History  . Marital status: Widowed    Spouse name: Benne  . Number of  children: 5  . Years of education: Not on file  . Highest education level: Bachelor's degree (e.g., BA, AB, BS)  Occupational History  . Occupation: Disabled  Tobacco Use  . Smoking status: Current Some Day Smoker    Packs/day: 0.25    Years: 50.00    Pack years: 12.50    Types: Cigarettes  . Smokeless tobacco: Never Used  . Tobacco comment: 3-4 cigarettes per day  Vaping Use  . Vaping Use: Never used  Substance and Sexual Activity  . Alcohol use: Yes    Comment: occ wine cooler  . Drug use: Not Currently    Types: IV    Comment: 37.5 yrs ago  . Sexual activity: Not Currently    Partners: Male    Birth control/protection: None  Other Topics Concern  . Not on file  Social History Narrative   Patient has 5 grown kids, 16 or 17 grandkids & about 3 great-grandkids.      Widowed for 4 yrs (10/6) now after 3yrs      Patient has moved here from [place].   Social Determinants of Health   Financial Resource Strain: Low Risk   . Difficulty of Paying Living Expenses: Not very hard  Food Insecurity: No Food Insecurity  . Worried About Programme researcher, broadcasting/film/video in the Last Year: Never true  . Ran Out of Food in the Last Year: Never true  Transportation Needs: No Transportation Needs  . Lack of Transportation (Medical): No  . Lack of Transportation (Non-Medical): No  Physical Activity: Insufficiently Active  . Days of Exercise per Week: 7 days  . Minutes of Exercise per Session: 20 min  Stress: No Stress Concern Present  . Feeling of Stress : Only a little  Social Connections: Socially Isolated  . Frequency of Communication with Friends and Family: More than three times a week  . Frequency of Social Gatherings with Friends and Family: Never  . Attends Religious Services: Never  . Active Member of Clubs or Organizations: No  . Attends Banker Meetings: Never  . Marital Status: Widowed    Tobacco Counseling Ready to quit: Not Answered Counseling given: Not  Answered Comment: 3-4 cigarettes per day   Clinical Intake:  Pre-visit preparation completed: Yes  Pain : 0-10 Pain Score: 7  Pain Type: Chronic pain Pain Location: Shoulder Pain Orientation: Right Pain Onset: More than a month ago Pain Frequency: Constant     Diabetes: No  How often do you need to have someone help you when you read instructions, pamphlets, or other written materials from your doctor or pharmacy?: 1 - Never    Interpreter Needed?: No  Information entered by :: Reather Littler LPN   Activities of Daily Living In your present state of health, do you have any difficulty performing the following activities: 08/11/2020 10/27/2019  Hearing? N N  Comment pt declines hearing aids -  Vision? Y Y  Comment needs eye exam -  Difficulty  concentrating or making decisions? Y N  Walking or climbing stairs? Y N  Dressing or bathing? N N  Doing errands, shopping? Malvin Johns  Preparing Food and eating ? N -  Using the Toilet? N -  In the past six months, have you accidently leaked urine? Y -  Comment wears pads/depends for protection -  Do you have problems with loss of bowel control? N -  Managing your Medications? Y -  Comment daughter assists -  Managing your Finances? N -  Housekeeping or managing your Housekeeping? N -  Some recent data might be hidden    Patient Care Team: Alba Cory, MD as PCP - General (Family Medicine)  Indicate any recent Medical Services you may have received from other than [facility] providers in the past year (date may be approximate).     Assessment:   This is a routine wellness examination for [place].  Hearing/Vision screen  Hearing Screening             Right ear:           Left ear:           Comments: Pt denies hearing difficulty  Vision Screening Comments: Past due for eye exam. Referral placed today for eye dr.   Dietary issues and exercise activities discussed: Current  Exercise Habits: Home exercise routine, Type of exercise: walking, Time (Minutes): 20, Frequency (Times/Week): 7, Weekly Exercise (Minutes/Week): 140, Intensity: Moderate, Exercise limited by: orthopedic condition(s)  Goals    . Patient Stated     Patient moved here from [place] and would like to become more social and meet new people once pandemic has passed       Depression Screen PHQ 2/9 Scores 08/11/2020 08/02/2020 10/27/2019 07/24/2019 04/24/2019 01/27/2019 01/23/2019  PHQ - 2 Score 0 0 0 0 0 0 0  PHQ- 9 Score - - 0 3 5 0 2  Exception Documentation - - - - - Medical reason -    Fall Risk Fall Risk  08/11/2020 08/02/2020 10/27/2019 07/24/2019 04/24/2019  Falls in the past year? 0 0 1 1 0  Number falls in past yr: 0 - 1 1 0  Injury with Fall? 0 - 1 0 0  Risk for fall due to : Impaired balance/gait;Impaired mobility;Impaired vision - History of fall(s) Impaired balance/gait -  Follow up Falls prevention discussed - Education provided Falls prevention discussed -    Any stairs in or around the home? Yes  If so, are there any without handrails? No  Home free of loose throw rugs in walkways, pet beds, electrical cords, etc? Yes  Adequate lighting in your home to reduce risk of falls? Yes   ASSISTIVE DEVICES UTILIZED TO PREVENT FALLS:  Life alert? No  Use of a cane, walker or w/c? No  Grab bars in the bathroom? Yes  Shower chair or bench in shower? No  Elevated toilet seat or a handicapped toilet? No   TIMED UP AND GO:  Was the test performed? No . Telephonic visit.   Cognitive Function:     6CIT Screen 08/11/2020 07/24/2019  What Year? 0 points 0 points  What month? 0 points 0 points  What time? 0 points 0 points  Count back from 20 0 points 0 points  Months in reverse 0 points 0 points  Repeat phrase 0 points 0 points  Total Score 0 0    Immunizations Immunization History  Administered Date(s) Administered  . PFIZER SARS-COV-2 Vaccination 05/12/2020  TDAP status: Due,  Education has been provided regarding the importance of this vaccine. Advised may receive this vaccine at local pharmacy or Health Dept. Aware to provide a copy of the vaccination record if obtained from local pharmacy or Health Dept. Verbalized acceptance and understanding.   Flu Vaccine status: Declined, Education has been provided regarding the importance of this vaccine but patient still declined. Advised may receive this vaccine at local pharmacy or Health Dept. Aware to provide a copy of the vaccination record if obtained from local pharmacy or Health Dept. Verbalized acceptance and understanding.   Pneumococcal vaccine status: Declined,  Education has been provided regarding the importance of this vaccine but patient still declined. Advised may receive this vaccine at local pharmacy or Health Dept. Aware to provide a copy of the vaccination record if obtained from local pharmacy or Health Dept. Verbalized acceptance and understanding.    Covid-19 vaccine status: Completed vaccines. Due for second dose.   Qualifies for Shingles Vaccine? Yes   Zostavax completed No   Shingrix Completed?: No.    Education has been provided regarding the importance of this vaccine. Patient has been advised to call insurance company to determine out of pocket expense if they have not yet received this vaccine. Advised may also receive vaccine at local pharmacy or Health Dept. Verbalized acceptance and understanding.  Screening Tests Health Maintenance  Topic Date Due  . TETANUS/TDAP  Never done  . MAMMOGRAM  Never done  . COLONOSCOPY  Never done  . DEXA SCAN  Never done  . PNA vac Low Risk Adult (1 of 2 - PCV13) Never done  . COVID-19 Vaccine (2 - Pfizer 2-dose series) 06/02/2020  . INFLUENZA VACCINE  07/17/2020  . Hepatitis C Screening  Completed    Health Maintenance  Health Maintenance Due  Topic Date Due  . TETANUS/TDAP  Never done  . MAMMOGRAM  Never done  . COLONOSCOPY  Never done  . DEXA SCAN   Never done  . PNA vac Low Risk Adult (1 of 2 - PCV13) Never done  . COVID-19 Vaccine (2 - Pfizer 2-dose series) 06/02/2020  . INFLUENZA VACCINE  07/17/2020    Colorectal cancer screening: Completed per patient in [place], need records.. Repeat every 10 years   Mammogram status: Ordered today. Pt provided with contact info and advised to call to schedule appt.    Bone Density status: Ordered today. Pt provided with contact info and advised to call to schedule appt.  Lung Cancer Screening: (Low Dose CT Chest recommended if Age 99-80 years, 30 pack-year currently smoking OR have quit w/in 15years.) does not qualify.   Additional Screening:  Hepatitis C Screening: does qualify; Completed 10/27/19  Vision Screening: Recommended annual ophthalmology exams for early detection of glaucoma and other disorders of the eye. Is the patient up to date with their annual eye exam?  No  Who is the provider or what is the name of the office in which the patient attends annual eye exams? Not established If pt is not established with a provider, would they like to be referred to a provider to establish care? Yes .   Dental Screening: Recommended annual dental exams for proper oral hygiene  Community Resource Referral / Chronic Care Management: CRR required this visit?  No   CCM required this visit?  No      Plan:     I have personally reviewed and noted the following in the patient's chart:   . Medical and social  history . Use of alcohol, tobacco or illicit drugs  . Current medications and supplements . Functional ability and status . Nutritional status . Physical activity . Advanced directives . List of other physicians . Hospitalizations, surgeries, and ER visits in previous 12 months . Vitals . Screenings to include cognitive, depression, and falls . Referrals and appointments  In addition, I have reviewed and discussed with patient certain preventive protocols, quality metrics, and  best practice recommendations. A written personalized care plan for preventive services as well as general preventive health recommendations were provided to patient.     Reather Littler, LPN   [id number]   Nurse Notes: pt advised due for appt with Dr. [name] and lab work.

## 2020-08-11 NOTE — Patient Instructions (Signed)
Natalie Lloyd , Thank you for taking time to come for your Medicare Wellness Visit. I appreciate your ongoing commitment to your health goals. Please review the following plan we discussed and let me know if I can assist you in the future.   Screening recommendations/referrals: Colonoscopy: need records for last colonoscopy date Mammogram: Please call [phone] to schedule your mammogram and bone density screening.  Recommended yearly ophthalmology/optometry visit for glaucoma screening and checkup Recommended yearly dental visit for hygiene and checkup  Vaccinations: Influenza vaccine: due Pneumococcal vaccine: due Tdap vaccine: due Shingles vaccine: Shingrix discussed. Please contact your pharmacy for coverage information.  Covid-19: done 05/12/20 due for second dose  Advanced directives: Please bring a copy of your health care power of attorney and living will to the office at your convenience.  Conditions/risks identified: Recommend active follow up for preventive screenings and wellness  Next appointment: Follow up in one year for your annual wellness visit    Preventive Care 65 Years and Older, Female Preventive care refers to lifestyle choices and visits with your health care provider that can promote health and wellness. What does preventive care include?  A yearly physical exam. This is also called an annual well check.  Dental exams once or twice a year.  Routine eye exams. Ask your health care provider how often you should have your eyes checked.  Personal lifestyle choices, including:  Daily care of your teeth and gums.  Regular physical activity.  Eating a healthy diet.  Avoiding tobacco and drug use.  Limiting alcohol use.  Practicing safe sex.  Taking low-dose aspirin every day.  Taking vitamin and mineral supplements as recommended by your health care provider. What happens during an annual well check? The services and screenings done by your health  care provider during your annual well check will depend on your age, overall health, lifestyle risk factors, and family history of disease. Counseling  Your health care provider may ask you questions about your:  Alcohol use.  Tobacco use.  Drug use.  Emotional well-being.  Home and relationship well-being.  Sexual activity.  Eating habits.  History of falls.  Memory and ability to understand (cognition).  Work and work Astronomer.  Reproductive health. Screening  You may have the following tests or measurements:  Height, weight, and BMI.  Blood pressure.  Lipid and cholesterol levels. These may be checked every 5 years, or more frequently if you are over 39 years old.  Skin check.  Lung cancer screening. You may have this screening every year starting at age 52 if you have a 30-pack-year history of smoking and currently smoke or have quit within the past 15 years.  Fecal occult blood test (FOBT) of the stool. You may have this test every year starting at age 26.  Flexible sigmoidoscopy or colonoscopy. You may have a sigmoidoscopy every 5 years or a colonoscopy every 10 years starting at age 70.  Hepatitis C blood test.  Hepatitis B blood test.  Sexually transmitted disease (STD) testing.  Diabetes screening. This is done by checking your blood sugar (glucose) after you have not eaten for a while (fasting). You may have this done every 1-3 years.  Bone density scan. This is done to screen for osteoporosis. You may have this done starting at age 52.  Mammogram. This may be done every 1-2 years. Talk to your health care provider about how often you should have regular mammograms. Talk with your health care provider about your test results, treatment  options, and if necessary, the need for more tests. Vaccines  Your health care provider may recommend certain vaccines, such as:  Influenza vaccine. This is recommended every year.  Tetanus, diphtheria, and  acellular pertussis (Tdap, Td) vaccine. You may need a Td booster every 10 years.  Zoster vaccine. You may need this after age 35.  Pneumococcal 13-valent conjugate (PCV13) vaccine. One dose is recommended after age 64.  Pneumococcal polysaccharide (PPSV23) vaccine. One dose is recommended after age 9. Talk to your health care provider about which screenings and vaccines you need and how often you need them. This information is not intended to replace advice given to you by your health care provider. Make sure you discuss any questions you have with your health care provider. Document Released: 12/30/2015 Document Revised: 08/22/2016 Document Reviewed: 10/04/2015 Elsevier Interactive Patient Education  2017 [place] Prevention in the Home Falls can cause injuries. They can happen to people of all ages. There are many things you can do to make your home safe and to help prevent falls. What can I do on the outside of my home?  Regularly fix the edges of walkways and driveways and fix any cracks.  Remove anything that might make you trip as you walk through a door, such as a raised step or threshold.  Trim any bushes or trees on the path to your home.  Use bright outdoor lighting.  Clear any walking paths of anything that might make someone trip, such as rocks or tools.  Regularly check to see if handrails are loose or broken. Make sure that both sides of any steps have handrails.  Any raised decks and porches should have guardrails on the edges.  Have any leaves, snow, or ice cleared regularly.  Use sand or salt on walking paths during winter.  Clean up any spills in your garage right away. This includes oil or grease spills. What can I do in the bathroom?  Use night lights.  Install grab bars by the toilet and in the tub and shower. Do not use towel bars as grab bars.  Use non-skid mats or decals in the tub or shower.  If you need to sit down in the shower, use  a plastic, non-slip stool.  Keep the floor dry. Clean up any water that spills on the floor as soon as it happens.  Remove soap buildup in the tub or shower regularly.  Attach bath mats securely with double-sided non-slip rug tape.  Do not have throw rugs and other things on the floor that can make you trip. What can I do in the bedroom?  Use night lights.  Make sure that you have a light by your bed that is easy to reach.  Do not use any sheets or blankets that are too big for your bed. They should not hang down onto the floor.  Have a firm chair that has side arms. You can use this for support while you get dressed.  Do not have throw rugs and other things on the floor that can make you trip. What can I do in the kitchen?  Clean up any spills right away.  Avoid walking on wet floors.  Keep items that you use a lot in easy-to-reach places.  If you need to reach something above you, use a strong step stool that has a grab bar.  Keep electrical cords out of the way.  Do not use floor polish or wax that makes floors slippery.  If you must use wax, use non-skid floor wax.  Do not have throw rugs and other things on the floor that can make you trip. What can I do with my stairs?  Do not leave any items on the stairs.  Make sure that there are handrails on both sides of the stairs and use them. Fix handrails that are broken or loose. Make sure that handrails are as long as the stairways.  Check any carpeting to make sure that it is firmly attached to the stairs. Fix any carpet that is loose or worn.  Avoid having throw rugs at the top or bottom of the stairs. If you do have throw rugs, attach them to the floor with carpet tape.  Make sure that you have a light switch at the top of the stairs and the bottom of the stairs. If you do not have them, ask someone to add them for you. What else can I do to help prevent falls?  Wear shoes that:  Do not have high heels.  Have  rubber bottoms.  Are comfortable and fit you well.  Are closed at the toe. Do not wear sandals.  If you use a stepladder:  Make sure that it is fully opened. Do not climb a closed stepladder.  Make sure that both sides of the stepladder are locked into place.  Ask someone to hold it for you, if possible.  Clearly mark and make sure that you can see:  Any grab bars or handrails.  First and last steps.  Where the edge of each step is.  Use tools that help you move around (mobility aids) if they are needed. These include:  Canes.  Walkers.  Scooters.  Crutches.  Turn on the lights when you go into a dark area. Replace any light bulbs as soon as they burn out.  Set up your furniture so you have a clear path. Avoid moving your furniture around.  If any of your floors are uneven, fix them.  If there are any pets around you, be aware of where they are.  Review your medicines with your doctor. Some medicines can make you feel dizzy. This can increase your chance of falling. Ask your doctor what other things that you can do to help prevent falls. This information is not intended to replace advice given to you by your health care provider. Make sure you discuss any questions you have with your health care provider. Document Released: 09/29/2009 Document Revised: 05/10/2016 Document Reviewed: 01/07/2015 Elsevier Interactive Patient Education  2017 [employer].

## 2020-08-17 ENCOUNTER — Other Ambulatory Visit: Payer: Self-pay | Admitting: Family Medicine

## 2020-08-17 DIAGNOSIS — I10 Essential (primary) hypertension: Secondary | ICD-10-CM

## 2020-08-17 DIAGNOSIS — F339 Major depressive disorder, recurrent, unspecified: Secondary | ICD-10-CM

## 2020-08-17 NOTE — Telephone Encounter (Signed)
Approved per protocol.  Requested Prescriptions  Pending Prescriptions Disp Refills   amLODipine (NORVASC) 5 MG tablet [Pharmacy Med Name: AMLODIPINE BESYLATE 5 MG TAB] 30 tablet 0    Sig: TAKE 1 TABLET BY MOUTH EVERY DAY     Cardiovascular:  Calcium Channel Blockers Passed - 08/17/2020  1:51 PM      Passed - Last BP in normal range    BP Readings from Last 1 Encounters:  08/11/20 113/77         Passed - Valid encounter within last 6 months    Recent Outpatient Visits          9 months ago Major depression, recurrent, chronic [facility])   [facility] Alba Cory, MD   1 year ago Mucopurulent chronic bronchitis [facility])   [facility] Alba Cory, MD   1 year ago Mucopurulent chronic bronchitis [facility])   [facility] Alba Cory, MD   1 year ago Mucopurulent chronic bronchitis [facility])   [facility] Alba Cory, MD

## 2020-09-13 ENCOUNTER — Other Ambulatory Visit: Payer: Self-pay | Admitting: Family Medicine

## 2020-09-13 DIAGNOSIS — I10 Essential (primary) hypertension: Secondary | ICD-10-CM

## 2020-09-13 NOTE — Telephone Encounter (Signed)
Call to patient- medication follow up scheduled- Rx RF #[id number]

## 2020-09-18 ENCOUNTER — Other Ambulatory Visit: Payer: Self-pay | Admitting: Family Medicine

## 2020-09-18 DIAGNOSIS — K219 Gastro-esophageal reflux disease without esophagitis: Secondary | ICD-10-CM

## 2020-09-18 NOTE — Telephone Encounter (Signed)
Requested Prescriptions  Pending Prescriptions Disp Refills  . pantoprazole (PROTONIX) 40 MG tablet [Pharmacy Med Name: PANTOPRAZOLE SOD DR 40 MG TAB] 90 tablet 0    Sig: TAKE 1 TABLET BY MOUTH EVERY DAY     Gastroenterology: Proton Pump Inhibitors Passed - 09/18/2020  8:51 AM      Passed - Valid encounter within last 12 months    Recent Outpatient Visits          10 months ago Major depression, recurrent, chronic [facility])   [facility] Alba Cory, MD   1 year ago Mucopurulent chronic bronchitis [facility])   [facility] Alba Cory, MD   1 year ago Mucopurulent chronic bronchitis [facility])   [facility] Alba Cory, MD   1 year ago Mucopurulent chronic bronchitis [facility])   [facility] Alba Cory, MD      Future Appointments            In 1 month [name], Danna Hefty, MD [facility], [facility]

## 2020-09-21 ENCOUNTER — Other Ambulatory Visit: Payer: Self-pay | Admitting: Family Medicine

## 2020-09-21 DIAGNOSIS — K219 Gastro-esophageal reflux disease without esophagitis: Secondary | ICD-10-CM

## 2020-10-11 ENCOUNTER — Other Ambulatory Visit: Payer: Self-pay | Admitting: Family Medicine

## 2020-10-11 DIAGNOSIS — I10 Essential (primary) hypertension: Secondary | ICD-10-CM

## 2020-10-11 NOTE — Telephone Encounter (Signed)
Requested Prescriptions  Pending Prescriptions Disp Refills   losartan (COZAAR) 100 MG tablet [Pharmacy Med Name: LOSARTAN POTASSIUM 100 MG TAB] 30 tablet 0    Sig: TAKE 1 TABLET BY MOUTH EVERY DAY     Cardiovascular:  Angiotensin Receptor Blockers Failed - 10/11/2020  9:32 AM      Failed - Cr in normal range and within 180 days    Creat  Date Value Ref Range Status  01/23/2019 0.94 (H) 0.60 - 0.93 mg/dL Final    Comment:    For patients >75 years of age, the reference limit for Creatinine is approximately 13% higher for people identified as African-American. .          Failed - K in normal range and within 180 days    Potassium  Date Value Ref Range Status  01/23/2019 4.3 3.5 - 5.3 mmol/L Final         Passed - Patient is not pregnant      Passed - Last BP in normal range    BP Readings from Last 1 Encounters:  08/11/20 113/77         Passed - Valid encounter within last 6 months    Recent Outpatient Visits          11 months ago Major depression, recurrent, chronic [facility])   [facility] Alba Cory, MD   1 year ago Mucopurulent chronic bronchitis [facility])   [facility] Alba Cory, MD   1 year ago Mucopurulent chronic bronchitis [facility])   [facility] Alba Cory, MD   1 year ago Mucopurulent chronic bronchitis [facility])   [facility] Alba Cory, MD      Future Appointments            In 1 week Alba Cory, MD [facility], [facility]

## 2020-10-17 ENCOUNTER — Other Ambulatory Visit: Payer: Self-pay | Admitting: Family Medicine

## 2020-10-17 DIAGNOSIS — K219 Gastro-esophageal reflux disease without esophagitis: Secondary | ICD-10-CM

## 2020-10-17 DIAGNOSIS — F5104 Psychophysiologic insomnia: Secondary | ICD-10-CM

## 2020-10-17 NOTE — Progress Notes (Signed)
Name: Natalie Lloyd   MRN: [id number]    DOB: [birth year]   Date:10/18/2020       Progress Note  Subjective  Chief Complaint  Chief Complaint  Patient presents with   Follow-up    medication    Sinusitis    I connected with  Natalie Lloyd on 10/18/20 at 10:40 AM EDT by telephone and verified that I am speaking with the correct person using two identifiers.  I discussed the limitations, risks, security and privacy concerns of performing an evaluation and management service by telephone and the availability of in person appointments. Staff also discussed with the patient that there may be a patient responsible charge related to this service. Patient agreed on having a virtual visit  Patient Location: home  Provider Location: [facility]   HPI    AR: she states change in weather or being outside when someone is mowing the yard she has sneezes, no nasal congestion or rhinorrhea. Denies cough, wheezing, SOB, body aches, fever or chills. This episode started a few days ago. We will start loratadine and flonase today   Patient Active Problem List   Diagnosis Date Noted   Dysfunction of left rotator cuff 08/02/2020   Primary osteoarthritis of left shoulder 08/02/2020   History of shoulder surgery 08/02/2020   Myofascial pain syndrome 08/02/2020   Chronic hepatitis C without hepatic coma (HCC) 10/27/2019   Long term current use of opiate analgesic 01/27/2019   Chronic upper extremity pain (R>L) 01/27/2019   Elevated TSH 01/27/2019   Elevated PTHrP level 01/27/2019   Vitamin D deficiency 01/27/2019   H/O cervical spine surgery 12/02/2018   Disorder of tendon of right biceps 12/02/2018   Pain in joint of right shoulder 12/02/2018   Arthropathy of right shoulder 12/02/2018   Chronic left shoulder pain 12/02/2018   Cervical spondylosis 12/02/2018   COPD (chronic obstructive pulmonary disease) (HCC) 10/22/2018   Hypertension 10/22/2018   Sleep apnea 10/22/2018   GERD  (gastroesophageal reflux disease) 10/22/2018   Osteoporosis 10/22/2018   History of pneumonia 10/22/2018   Major depression, recurrent, chronic (HCC) 10/22/2018   Chronic insomnia 10/22/2018   DDD (degenerative disc disease), lumbar 10/22/2018    Past Surgical History:  Procedure Laterality Date   APPENDECTOMY     CARPAL TUNNEL RELEASE     CERVICAL SPINE SURGERY     ROTATOR CUFF REPAIR      History reviewed. No pertinent family history.    Current Outpatient Medications:    albuterol (VENTOLIN HFA) 108 (90 Base) MCG/ACT inhaler, INHALE 2 PUFFS INTO THE LUNGS EVERY 6 HOURS AS NEEDED FOR WHEEZING OR SHORTNESS OF BREATH, Disp: 18 g, Rfl: 2   amLODipine (NORVASC) 5 MG tablet, TAKE 1 TABLET BY MOUTH EVERY DAY, Disp: 30 tablet, Rfl: 0   calcium-vitamin D (OSCAL WITH D) 500-200 MG-UNIT tablet, Take 1 tablet by mouth., Disp: , Rfl:    Cholecalciferol (VITAMIN D) 50 MCG (2000 UT) CAPS, Take 1 capsule (2,000 Units total) by mouth daily., Disp: 100 capsule, Rfl: 1   HYDROcodone-acetaminophen (NORCO) 10-325 MG tablet, Take 1 tablet by mouth every 8 (eight) hours as needed for severe pain., Disp: 90 tablet, Rfl: 0   losartan (COZAAR) 100 MG tablet, TAKE 1 TABLET BY MOUTH EVERY DAY, Disp: 30 tablet, Rfl: 0   pantoprazole (PROTONIX) 40 MG tablet, TAKE 1 TABLET BY MOUTH EVERY DAY, Disp: 90 tablet, Rfl: 0   PARoxetine (PAXIL) 40 MG tablet, TAKE 1 TABLET BY MOUTH EVERY DAY IN THE  MORNING, Disp: 90 tablet, Rfl: 1   traZODone (DESYREL) 50 MG tablet, TAKE 1 TABLET (50 MG TOTAL) BY MOUTH AT BEDTIME AS NEEDED FOR SLEEP., Disp: 90 tablet, Rfl: 0   gabapentin (NEURONTIN) 100 MG capsule, TAKE 1 CAPSULE BY MOUTH THREE TIMES A DAY (Patient not taking: Reported on 10/18/2020), Disp: 270 capsule, Rfl: 0   loratadine (CLARITIN) 10 MG tablet, Take 1 tablet (10 mg total) by mouth daily., Disp: 90 tablet, Rfl: 1  No Known Allergies  I personally reviewed active problem list, medication list,  allergies, family history, social history, health maintenance with the patient/caregiver today.   ROS  Ten systems reviewed and is negative except as mentioned in HPI   Objective  Virtual encounter, vitals not obtained.  Body mass index is 26.76 kg/m.  Physical Exam  Awake, alert and oriented  PHQ2/9: Depression screen [facility] 2/9 10/18/2020 08/11/2020 08/02/2020 10/27/2019 07/24/2019  Decreased Interest 0 0 0 0 0  Down, Depressed, Hopeless 0 0 0 0 0  PHQ - 2 Score 0 0 0 0 0  Altered sleeping 0 - - 0 1  Tired, decreased energy 0 - - 0 1  Change in appetite 0 - - 0 0  Feeling bad or failure about yourself  0 - - 0 0  Trouble concentrating 0 - - 0 1  Moving slowly or fidgety/restless 0 - - 0 0  Suicidal thoughts 0 - - 0 0  PHQ-9 Score 0 - - 0 3  Difficult doing work/chores - - - - Not difficult at all   PHQ-2/9 Result is negative.    Fall Risk: Fall Risk  10/18/2020 08/11/2020 08/02/2020 10/27/2019 07/24/2019  Falls in the past year? 1 0 0 1 1  Number falls in past yr: 0 0 - 1 1  Injury with Fall? 0 0 - 1 0  Risk for fall due to : History of fall(s);Impaired balance/gait Impaired balance/gait;Impaired mobility;Impaired vision - History of fall(s) Impaired balance/gait  Follow up - Falls prevention discussed - Education provided Falls prevention discussed     Assessment & Plan   1. Seasonal allergic rhinitis, unspecified trigger  - loratadine (CLARITIN) 10 MG tablet; Take 1 tablet (10 mg total) by mouth daily.  Dispense: 90 tablet; Refill: 1  I discussed the assessment and treatment plan with the patient. The patient was provided an opportunity to ask questions and all were answered. The patient agreed with the plan and demonstrated an understanding of the instructions.   The patient was advised to call back or seek an in-person evaluation if the symptoms worsen or if the condition fails to improve as anticipated.  I provided 15 minutes of non-face-to-face time during this  encounter.  Ruel Favors, MD

## 2020-10-18 ENCOUNTER — Ambulatory Visit (INDEPENDENT_AMBULATORY_CARE_PROVIDER_SITE_OTHER): Payer: Medicare Other | Admitting: Family Medicine

## 2020-10-18 ENCOUNTER — Encounter: Payer: Self-pay | Admitting: Family Medicine

## 2020-10-18 DIAGNOSIS — Z1211 Encounter for screening for malignant neoplasm of colon: Secondary | ICD-10-CM

## 2020-10-18 DIAGNOSIS — J302 Other seasonal allergic rhinitis: Secondary | ICD-10-CM

## 2020-10-18 MED ORDER — LORATADINE 10 MG PO TABS: 10.0000 mg | ORAL_TABLET | Freq: Every day | ORAL | 1 refills | Status: AC

## 2020-10-25 NOTE — Progress Notes (Signed)
Patient needs an in person visit, I did an acute visit on her otc 10/18/2020 and explained that I need to see in person for regular follow up. We will re-schedule her again

## 2020-10-27 ENCOUNTER — Other Ambulatory Visit: Payer: Self-pay

## 2020-10-27 ENCOUNTER — Encounter: Payer: Self-pay | Admitting: Family Medicine

## 2020-10-27 ENCOUNTER — Ambulatory Visit (INDEPENDENT_AMBULATORY_CARE_PROVIDER_SITE_OTHER): Payer: Medicare Other | Admitting: Family Medicine

## 2020-10-27 DIAGNOSIS — Z1211 Encounter for screening for malignant neoplasm of colon: Secondary | ICD-10-CM

## 2020-11-01 ENCOUNTER — Encounter: Payer: Self-pay | Admitting: Student in an Organized Health Care Education/Training Program

## 2020-11-01 ENCOUNTER — Other Ambulatory Visit: Payer: Self-pay

## 2020-11-01 ENCOUNTER — Ambulatory Visit
Payer: Medicare Other | Attending: Student in an Organized Health Care Education/Training Program | Admitting: Student in an Organized Health Care Education/Training Program

## 2020-11-01 VITALS — BP 145/70 | HR 58 | Temp 97.4°F | Resp 16 | Ht 68.0 in | Wt 176.0 lb

## 2020-11-01 DIAGNOSIS — M79602 Pain in left arm: Secondary | ICD-10-CM | POA: Diagnosis not present

## 2020-11-01 DIAGNOSIS — M67912 Unspecified disorder of synovium and tendon, left shoulder: Secondary | ICD-10-CM | POA: Diagnosis not present

## 2020-11-01 DIAGNOSIS — Z9889 Other specified postprocedural states: Secondary | ICD-10-CM

## 2020-11-01 DIAGNOSIS — M19012 Primary osteoarthritis, left shoulder: Secondary | ICD-10-CM

## 2020-11-01 DIAGNOSIS — G894 Chronic pain syndrome: Secondary | ICD-10-CM

## 2020-11-01 DIAGNOSIS — M79601 Pain in right arm: Secondary | ICD-10-CM

## 2020-11-01 DIAGNOSIS — M25512 Pain in left shoulder: Secondary | ICD-10-CM | POA: Diagnosis not present

## 2020-11-01 DIAGNOSIS — G8929 Other chronic pain: Secondary | ICD-10-CM | POA: Diagnosis not present

## 2020-11-01 MED ORDER — HYDROCODONE-ACETAMINOPHEN 10-325 MG PO TABS: 1.0000 | ORAL_TABLET | Freq: Three times a day (TID) | ORAL | 0 refills | Status: AC | PRN

## 2020-11-01 NOTE — Progress Notes (Signed)
PROVIDER NOTE: Information contained herein reflects review and annotations entered in association with encounter. Interpretation of such information and data should be left to medically-trained personnel. Information provided to patient can be located elsewhere in the medical record under "Patient Instructions". Document created using STT-dictation technology, any transcriptional errors that may result from process are unintentional.    Patient: Natalie Lloyd  Service Category: E/M  Provider: Gillis Santa, MD  DOB: 04-11-1946  DOS: 11/01/2020  Specialty: Interventional Pain Management  MRN: [id number]  Setting: Ambulatory outpatient  PCP: Steele Sizer, MD  Type: Established Patient    Referring Provider: Steele Sizer, MD  Location: Office  Delivery: Face-to-face     HPI  [name], a 74 y.o. year old female, is here today because of her Chronic left shoulder pain [M25.512, G89.29]. [name] primary complain today is Shoulder Pain (right) Last encounter: My last encounter with her was on 08/02/2020. Pertinent problems: [name] has DDD (degenerative disc disease), lumbar; H/O cervical spine surgery; Pain in joint of right shoulder; Arthropathy of right shoulder; Chronic left shoulder pain; Cervical spondylosis; Long term current use of opiate analgesic; and Chronic upper extremity pain (R>L) on their pertinent problem list. Pain Assessment: Severity of Chronic pain is reported as a 7 /10. Location: Shoulder Right/radiates to elbow. Onset: More than a month ago. Quality: Sharp. Timing: Intermittent. Modifying factor(s): medication. Vitals:  height is _0  (1.727 m) and weight is 176 lb (79.8 kg). Her temperature is 97.4 F (36.3 C) (abnormal). Her blood pressure is 145/70 (abnormal) and her pulse is 58 (abnormal). Her respiration is 16 and oxygen saturation is 100%.   Reason for encounter: medication management.   Patient follows up today for medication management for persistent  left shoulder pain related to left shoulder osteoarthritis. She has had shoulder steroid injections in the past which were not effective.  We have discussed sprint peripheral nerve stimulation of left axillary nerve for her shoulder pain.  The patient has been provided with resources regarding the stimulation based therapy.  Of note, the patient found out last night that her brother passed away in [place] due to cancer.  She is very sad and tearful regarding this.  She is hoping to go out to [place] on Thursday to be with her family.  I expressed my condolences.  I will refill Jaleiyah's hydrocodone as below, PMP appropriate, repeat UDS today.  Pharmacotherapy Assessment   10/10/2020  08/02/2020   1  Hydrocodone-Acetamin 10-325 Mg 90.00  30  Bi Lat  [id number]  Nor (1409)  0/0  30.00 MME  Medicare  [place]      Analgesic: Norco 10 mg 3 times daily as needed, quantity 90/month; MME equals 30    Monitoring: [place] PMP: PDMP reviewed during this encounter.       Pharmacotherapy: No side-effects or adverse reactions reported. Compliance: No problems identified. Effectiveness: Clinically acceptable.  Dewayne Shorter, RN  11/01/2020  9:49 AM  Signed Nursing Pain Medication Assessment:  Safety precautions to be maintained throughout the outpatient stay will include: orient to surroundings, keep bed in low position, maintain call bell within reach at all times, provide assistance with transfer out of bed and ambulation.  Medication Inspection Compliance: Pill count conducted under aseptic conditions, in front of the patient. Neither the pills nor the bottle was removed from the patient's sight at any time. Once count was completed pills were immediately returned to the patient in their original bottle.  Medication: Hydrocodone/APAP Pill/Patch Count: 5  of 90 pills remain Pill/Patch Appearance: Markings consistent with prescribed medication Bottle Appearance: Standard pharmacy container. Clearly labeled. Filled  Date:10 /25 / 2021 Last Medication intake:  Today  Patient states she has an 8 days upply in a pill box at home.     UDS:  Summary  Date Value Ref Range Status  11/25/2018 FINAL  Final    Comment:    ==================================================================== TOXAS[facility]RE COMP DRUG ANALYSIS,UR ==================================================================== Test                             Result       Flag       Units Drug Present and Declared for Prescription Verification   Hydrocodone                    35           EXPECTED   ng/mg creat    Sources of hydrocodone include scheduled prescription    medications.   Gabapentin                     PRESENT      EXPECTED   Paroxetine                     PRESENT      EXPECTED   Trazodone                      PRESENT      EXPECTED   1,3 chlorophenyl piperazine    PRESENT      EXPECTED    1,3-chlorophenyl piperazine is an expected metabolite of    trazodone.   Acetaminophen                  PRESENT      EXPECTED Drug Present not Declared for Prescription Verification   Cyclobenzaprine                PRESENT      UNEXPECTED   Desmethylcyclobenzaprine       PRESENT      UNEXPECTED    Desmethylcyclobenzaprine is an expected metabolite of    cyclobenzaprine.   Naproxen                       PRESENT      UNEXPECTED   Diphenhydramine                PRESENT      UNEXPECTED ==================================================================== Test                      Result    Flag   Units      Ref Range   Creatinine              145              mg/dL      >=20 ==================================================================== Declared Medications:  The flagging and interpretation on this report are based on the  following declared medications.  Unexpected results may arise from  inaccuracies in the declared medications.  **Note: The testing scope of this panel includes these medications:  Gabapentin  Hydrocodone  (Hydrocodone-Acetaminophen)  Paroxetine  Trazodone  **Note: The testing scope of this panel does not include small to  moderate amounts of these reported medications:  Acetaminophen (Hydrocodone-Acetaminophen)  **Note: The testing scope of this panel  does not include following  reported medications:  Albuterol  Amlodipine  Losartan  Pantoprazole ==================================================================== For clinical consultation, please call [phone]. ====================================================================      ROS  Constitutional: Denies any fever or chills Gastrointestinal: No reported hemesis, hematochezia, vomiting, or acute GI distress Musculoskeletal: Denies any acute onset joint swelling, redness, loss of ROM, or weakness Neurological: No reported episodes of acute onset apraxia, aphasia, dysarthria, agnosia, amnesia, paralysis, loss of coordination, or loss of consciousness  Medication Review  HYDROcodone-acetaminophen, PARoxetine, Vitamin D, albuterol, amLODipine, calcium-vitamin D, gabapentin, loratadine, losartan, pantoprazole, and traZODone  History Review  Allergy: [name] has No Known Allergies. Drug: [name]  reports previous drug use. Drug: IV. Alcohol:  reports current alcohol use. Tobacco:  reports that she has been smoking cigarettes. She has a 12.50 pack-year smoking history. She has never used smokeless tobacco. Social: [name]  reports that she has been smoking cigarettes. She has a 12.50 pack-year smoking history. She has never used smokeless tobacco. She reports current alcohol use. She reports previous drug use. Drug: IV. Medical:  has a past medical history of Anxiety, Arthritis, COPD (chronic obstructive pulmonary disease) ([place]), DDD (degenerative disc disease), lumbar, Depression, Generalized OA, GERD (gastroesophageal reflux disease), Hypertension, Myofascial pain syndrome (08/02/2020), Osteoporosis, Sleep apnea, and  Substance abuse ([place]). Surgical: [name]  has a past surgical history that includes Rotator cuff repair; Appendectomy; Cervical spine surgery; and Carpal tunnel release. Family: family history is not on file.  Laboratory Chemistry Profile   Renal Lab Results  Component Value Date   BUN 12 01/23/2019   CREATININE 0.94 (H) 01/23/2019   BCR 13 01/23/2019   GFRAA 70 01/23/2019   GFRNONAA 60 01/23/2019     Hepatic Lab Results  Component Value Date   AST 25 01/23/2019   ALT 15 01/23/2019     Electrolytes Lab Results  Component Value Date   NA 139 01/23/2019   K 4.3 01/23/2019   CL 102 01/23/2019   CALCIUM 9.4 01/23/2019     Bone Lab Results  Component Value Date   VD25OH 10 (L) 01/23/2019     Inflammation (CRP: Acute Phase) (ESR: Chronic Phase) No results found for: CRP, ESRSEDRATE, LATICACIDVEN     Note: Above Lab results reviewed.  Recent Imaging Review  No image results found. Note: Reviewed        Physical Exam  General appearance: Well nourished, well developed, and well hydrated. In no apparent acute distress Mental status: Alert, oriented x 3 (person, place, & time)       Respiratory: No evidence of acute respiratory distress Eyes: PERLA Vitals: BP (!) 145/70   Pulse (!) 58   Temp (!) 97.4 F (36.3 C)   Resp 16   Ht _0  (1.727 m)   Wt 176 lb (79.8 kg)   SpO2 100%   BMI 26.76 kg/m  BMI: Estimated body mass index is 26.76 kg/m as calculated from the following:   Height as of this encounter: _1  (1.727 m).   Weight as of this encounter: 176 lb (79.8 kg). Ideal: Ideal body weight: 63.9 kg (140 lb 14 oz) Adjusted ideal body weight: 70.3 kg (154 lb 14.8 oz)  Cervical Spine Area Exam  Skin & Axial Inspection: No masses, redness, edema, swelling, or associated skin lesions Alignment: Symmetrical Functional ROM: Pain restricted ROM      Stability: No instability detected Muscle Tone/Strength: Functionally intact. No obvious neuro-muscular  anomalies detected. Sensory (Neurological): Musculoskeletal pain pattern  Upper  Extremity (UE) Exam    Side: Right upper extremity  Side: Left upper extremity  Skin & Extremity Inspection: Evidence of prior arthroplastic surgery  Skin & Extremity Inspection: Skin color, temperature, and hair growth are WNL. No peripheral edema or cyanosis. No masses, redness, swelling, asymmetry, or associated skin lesions. No contractures.  Functional ROM: Pain restricted ROM for shoulder  Functional ROM: Pain restricted ROM for shoulder  Muscle Tone/Strength: Functionally intact. No obvious neuro-muscular anomalies detected.  Muscle Tone/Strength: Functionally intact. No obvious neuro-muscular anomalies detected.  Sensory (Neurological): Arthropathic arthralgia          Sensory (Neurological): Articular pain pattern          Palpation: No palpable anomalies              Palpation: No palpable anomalies              Provocative Test(s):  Phalen's test: deferred Tinel's test: deferred Apley's scratch test (touch opposite shoulder):  Action 1 (Across chest): Decreased ROM Action 2 (Overhead): Decreased ROM Action 3 (LB reach): Decreased ROM   Provocative Test(s):  Phalen's test: deferred Tinel's test: deferred Apley's scratch test (touch opposite shoulder):  Action 1 (Across chest): Decreased ROM Action 2 (Overhead): Decreased ROM Action 3 (LB reach): Decreased ROM       Assessment   Status Diagnosis  Controlled Controlled Controlled 1. Chronic left shoulder pain   2. Dysfunction of left rotator cuff   3. Chronic pain syndrome   4. Chronic pain of both upper extremities   5. Primary osteoarthritis of left shoulder   6. History of shoulder surgery      Plan of Care   [name] has a current medication list which includes the following long-term medication(s): albuterol, amlodipine, calcium-vitamin d, [START ON 11/09/2020] hydrocodone-acetaminophen, [START ON 12/09/2020]  hydrocodone-acetaminophen, [START ON 01/08/2021] hydrocodone-acetaminophen, loratadine, losartan, pantoprazole, paroxetine, trazodone, and gabapentin.  Pharmacotherapy (Medications Ordered): Meds ordered this encounter  Medications  . HYDROcodone-acetaminophen (NORCO) 10-325 MG tablet    Sig: Take 1 tablet by mouth every 8 (eight) hours as needed for severe pain.    Dispense:  90 tablet    Refill:  0    Do not place this medication, or any other prescription from our practice, on "Automatic Refill". Patient may have prescription filled one day early if pharmacy is closed on scheduled refill date.  Marland Kitchen HYDROcodone-acetaminophen (NORCO) 10-325 MG tablet    Sig: Take 1 tablet by mouth every 8 (eight) hours as needed for severe pain.    Dispense:  90 tablet    Refill:  0    Do not place this medication, or any other prescription from our practice, on "Automatic Refill". Patient may have prescription filled one day early if pharmacy is closed on scheduled refill date.  Marland Kitchen HYDROcodone-acetaminophen (NORCO) 10-325 MG tablet    Sig: Take 1 tablet by mouth every 8 (eight) hours as needed for severe pain.    Dispense:  90 tablet    Refill:  0    Do not place this medication, or any other prescription from our practice, on "Automatic Refill". Patient may have prescription filled one day early if pharmacy is closed on scheduled refill date.   Orders:  Orders Placed This Encounter  Procedures  . ToxASSURE Select 13 (MW), Urine    Volume: 30 ml(s). Minimum 3 ml of urine is needed. Document temperature of fresh sample. Indications: Long term (current) use of opiate analgesic (X10.626)    Order  Specific Question:   Release to patient    Answer:   Immediate   Follow-up plan:   Return in about 3 months (around 02/01/2021) for Medication Management, in person.     Future considerations Cervical facet joint syndrome: Diagnostic cervical facet medial branch nerve blocks with possible cervical radiofrequency  ablation Right shoulder arthropathy and degenerative disease status post 5 right shoulder surgeries: Right suprascapular nerve block.  Consider left Sprint peripheral nerve stimulation of left axillary nerve for left shoulder pain       Recent Visits No visits were found meeting these conditions. Showing recent visits within past 90 days and meeting all other requirements Today's Visits Date Type Provider Dept  11/01/20 Office Visit Gillis Santa, MD [facility]  Showing today's visits and meeting all other requirements Future Appointments Date Type Provider Dept  01/26/21 Appointment Gillis Santa, MD [facility]  Showing future appointments within next 90 days and meeting all other requirements  I discussed the assessment and treatment plan with the patient. The patient was provided an opportunity to ask questions and all were answered. The patient agreed with the plan and demonstrated an understanding of the instructions.  Patient advised to call back or seek an in-person evaluation if the symptoms or condition worsens.  Duration of encounter: 90mnutes.  Note by: BGillis Santa MD Date: 11/01/2020; Time: 10:19 AM

## 2020-11-01 NOTE — Progress Notes (Signed)
Nursing Pain Medication Assessment:  Safety precautions to be maintained throughout the outpatient stay will include: orient to surroundings, keep bed in low position, maintain call bell within reach at all times, provide assistance with transfer out of bed and ambulation.  Medication Inspection Compliance: Pill count conducted under aseptic conditions, in front of the patient. Neither the pills nor the bottle was removed from the patient's sight at any time. Once count was completed pills were immediately returned to the patient in their original bottle.  Medication: Hydrocodone/APAP Pill/Patch Count: 5 of 90 pills remain Pill/Patch Appearance: Markings consistent with prescribed medication Bottle Appearance: Standard pharmacy container. Clearly labeled. Filled Date:10 /25 / 2021 Last Medication intake:  Today  Patient states she has an 8 days upply in a pill box at home.

## 2020-11-07 NOTE — Progress Notes (Signed)
Name: Natalie Lloyd   MRN: [id number]    DOB: 1946-04-25   Date:11/08/2020       Progress Note  Subjective  Chief Complaint  Follow up   HPI   COPD : diagnosed years ago, not on maintenance medication and does not want it at this time, she only has albuterol prn and uses at most twice a week.  She states SOB and cough got worse after the pneumonia ( admitted in [place] ) , but is back to her baseline, she states has sob with moderate activity, denies wheezing or daily cough. She is still smoking, but down to a few cigarettes daily. She initially refused flu, pneumonia and shingles. Explained importance of preventive care and she is willing to get pneumonia vaccine today .   HTN: she is taking medications,bp is at goal, no dizziness, chest pain or palpitation. BP at home was at goal , however it was low during pain clinic visit, never felt dizzy or had palpitation. BP is towards low end of normal today but since bp at home can go to 140's we will continue current dose   Osteoporosis: she has not been taking medication, I still don't have her old records from [place]. Explained that she needs to have a repeat bone density and she continues to refuse it at this time  Chronic pain: she had multiple surgeries, she has DDD spine and also right shoulder surgery and history of dislocation, she was seeing pain clinic at [facility] now under the care of Dr. Cherylann Ratel and NP Marena Chancy , taking hydrocodone TID, she ran out of gabapentin and thinking back she states medication did not improve symptoms, so we will not fill medication.  She states pain level at this time is 5/10 , she did not have time to take medication this morning, usually pain is below 5/10   GERD: she is on pantoprazole and she states no heartburn or regurgitation with medication, she is aware of long term side effects   Major Depression: she has a long history of depression, states she used to go weeks without living  her house because of sadness. She started medication many years ago - about 15 years. Medication works well for her.She is happy living in [place] with her daughter Cala Bradford, she states feeling down now because of the holidays, missed her husband and her parents that are no longer alive.   Insomnia: she takes Trazodone prn only , she states when she takes medications she sleeps for about 12 hours and feels very rested. Unchanged   Elevated TSH and PTH: we will recheck levels today , GERD is under control, she has some leg cramps intermittently. No dysphagia , hair loss  Chronic hepatitis C: she was offered therapy in the past but she refused , discussed referral to GI now but she refuses , explained therapy is much safer and also the risk of liver failure and liver cancer  Malnutrition: she has lost 20 lbs based on a record in our chart, weigh was up to 176 lbs, based on my records from pre-pandemic weight was 165 lbs, so she has lost at least 10 lbs. She states she has normal appetite but smaller portions , denies abdominal pain, discussed importance of cancer screening such as mammogram, colonoscopy, consider EGD seems to have early satiety ( unable to eat a full burger in one sitting),. She refuses it   Patient Active Problem List   Diagnosis Date Noted   Dysfunction of  left rotator cuff 08/02/2020   Primary osteoarthritis of left shoulder 08/02/2020   History of shoulder surgery 08/02/2020   Myofascial pain syndrome 08/02/2020   Chronic hepatitis C without hepatic coma (HCC) 10/27/2019   Long term current use of opiate analgesic 01/27/2019   Chronic upper extremity pain (R>L) 01/27/2019   Elevated TSH 01/27/2019   Elevated PTHrP level 01/27/2019   Vitamin D deficiency 01/27/2019   H/O cervical spine surgery 12/02/2018   Disorder of tendon of right biceps 12/02/2018   Pain in joint of right shoulder 12/02/2018   Arthropathy of right shoulder 12/02/2018   Chronic left  shoulder pain 12/02/2018   Cervical spondylosis 12/02/2018   COPD (chronic obstructive pulmonary disease) (HCC) 10/22/2018   Hypertension 10/22/2018   Sleep apnea 10/22/2018   GERD (gastroesophageal reflux disease) 10/22/2018   Osteoporosis 10/22/2018   History of pneumonia 10/22/2018   Major depression, recurrent, chronic (HCC) 10/22/2018   Chronic insomnia 10/22/2018   DDD (degenerative disc disease), lumbar 10/22/2018    Past Surgical History:  Procedure Laterality Date   APPENDECTOMY     CARPAL TUNNEL RELEASE     CERVICAL SPINE SURGERY     ROTATOR CUFF REPAIR      History reviewed. No pertinent family history.  Social History   Tobacco Use   Smoking status: Current Some Day Smoker    Packs/day: 0.25    Years: 50.00    Pack years: 12.50    Types: Cigarettes   Smokeless tobacco: Never Used   Tobacco comment: 3-4 cigarettes per day  Substance Use Topics   Alcohol use: Yes    Comment: occ wine cooler     Current Outpatient Medications:    albuterol (VENTOLIN HFA) 108 (90 Base) MCG/ACT inhaler, INHALE 2 PUFFS INTO THE LUNGS EVERY 6 HOURS AS NEEDED FOR WHEEZING OR SHORTNESS OF BREATH, Disp: 18 g, Rfl: 2   amLODipine (NORVASC) 5 MG tablet, TAKE 1 TABLET BY MOUTH EVERY DAY, Disp: 30 tablet, Rfl: 0   calcium-vitamin D (OSCAL WITH D) 500-200 MG-UNIT tablet, Take 1 tablet by mouth., Disp: , Rfl:    Cholecalciferol (VITAMIN D) 50 MCG (2000 UT) CAPS, Take 1 capsule (2,000 Units total) by mouth daily., Disp: 100 capsule, Rfl: 1   [START ON 01/08/2021] HYDROcodone-acetaminophen (NORCO) 10-325 MG tablet, Take 1 tablet by mouth every 8 (eight) hours as needed for severe pain., Disp: 90 tablet, Rfl: 0   loratadine (CLARITIN) 10 MG tablet, Take 1 tablet (10 mg total) by mouth daily., Disp: 90 tablet, Rfl: 1   losartan (COZAAR) 100 MG tablet, TAKE 1 TABLET BY MOUTH EVERY DAY, Disp: 30 tablet, Rfl: 0   pantoprazole (PROTONIX) 40 MG tablet, TAKE 1 TABLET BY MOUTH  EVERY DAY, Disp: 90 tablet, Rfl: 0   PARoxetine (PAXIL) 40 MG tablet, TAKE 1 TABLET BY MOUTH EVERY DAY IN THE MORNING, Disp: 90 tablet, Rfl: 1   traZODone (DESYREL) 50 MG tablet, TAKE 1 TABLET (50 MG TOTAL) BY MOUTH AT BEDTIME AS NEEDED FOR SLEEP., Disp: 90 tablet, Rfl: 0   gabapentin (NEURONTIN) 100 MG capsule, TAKE 1 CAPSULE BY MOUTH THREE TIMES A DAY (Patient not taking: Reported on 10/18/2020), Disp: 270 capsule, Rfl: 0   [START ON 11/09/2020] HYDROcodone-acetaminophen (NORCO) 10-325 MG tablet, Take 1 tablet by mouth every 8 (eight) hours as needed for severe pain. (Patient not taking: Reported on 11/08/2020), Disp: 90 tablet, Rfl: 0   [START ON 12/09/2020] HYDROcodone-acetaminophen (NORCO) 10-325 MG tablet, Take 1 tablet by mouth every 8 (  eight) hours as needed for severe pain. (Patient not taking: Reported on 11/08/2020), Disp: 90 tablet, Rfl: 0  No Known Allergies  I personally reviewed active problem list, medication list, allergies, family history, social history, health maintenance with the patient/caregiver today.   ROS  Constitutional: Negative for fever or weight change.  Respiratory: Negative for cough and shortness of breath.   Cardiovascular: Negative for chest pain or palpitations.  Gastrointestinal: Negative for abdominal pain, no bowel changes.  Musculoskeletal: Negative for gait problem or joint swelling.  Skin: Negative for rash.  Neurological: Negative for dizziness or headache.  No other specific complaints in a complete review of systems (except as listed in HPI above).  Objective  Vitals:   11/08/20 1005  BP: 122/74  Pulse: 84  Resp: 16  Temp: 99 F (37.2 C)  TempSrc: Oral  SpO2: 98%  Weight: 153 lb 1.6 oz (69.4 kg)  Height: 5\' 8"  (1.727 m)    Body mass index is 23.28 kg/m.  Physical Exam  Constitutional: Patient appears well-developed and well-nourished.  No distress.  HEENT: head atraumatic, normocephalic, pupils equal and reactive to light,  neck supple Cardiovascular: Normal rate, regular rhythm and normal heart sounds.  No murmur heard. No BLE edema. Pulmonary/Chest: Effort normal and breath sounds normal. No respiratory distress. Abdominal: Soft.  There is no tenderness. Psychiatric: Patient has a normal mood and affect. behavior is normal. Judgment and thought content normal.  PHQ2/9: Depression screen [facility] 2/9 11/08/2020 11/01/2020 10/27/2020 10/18/2020 08/11/2020  Decreased Interest 1 0 0 0 0  Down, Depressed, Hopeless 0 0 0 0 0  PHQ - 2 Score 1 0 0 0 0  Altered sleeping 1 - - 0 -  Tired, decreased energy 1 - - 0 -  Change in appetite 0 - - 0 -  Feeling bad or failure about yourself  0 - - 0 -  Trouble concentrating 0 - - 0 -  Moving slowly or fidgety/restless 0 - - 0 -  Suicidal thoughts 0 - - 0 -  PHQ-9 Score 3 - - 0 -  Difficult doing work/chores Not difficult at all - - - -  Some recent data might be hidden    phq 9 is positive   Fall Risk: Fall Risk  11/08/2020 11/01/2020 10/27/2020 10/18/2020 08/11/2020  Falls in the past year? 1 0 0 1 0  Number falls in past yr: 0 - 0 0 0  Injury with Fall? 0 - 0 0 0  Risk for fall due to : History of fall(s) - - History of fall(s);Impaired balance/gait Impaired balance/gait;Impaired mobility;Impaired vision  Follow up - - - - Falls prevention discussed     Functional Status Survey: Is the patient deaf or have difficulty hearing?: No Does the patient have difficulty seeing, even when wearing glasses/contacts?: Yes Does the patient have difficulty concentrating, remembering, or making decisions?: No Does the patient have difficulty walking or climbing stairs?: No Does the patient have difficulty dressing or bathing?: No Does the patient have difficulty doing errands alone such as visiting a doctor's office or shopping?: No   Assessment & Plan  1. Mucopurulent chronic bronchitis (HCC)  Refused maintenance therapy   2. Age-related osteoporosis without current  pathological fracture   3. Colon cancer screening  refused  4. Gastroesophageal reflux disease without esophagitis  Stable  5. Vitamin D deficiency  - VITAMIN D 25 Hydroxy (Vit-D Deficiency, Fractures)  6. Elevated PTHrP level  - Parathyroid hormone, intact (no Ca) - VITAMIN  D 25 Hydroxy (Vit-D Deficiency, Fractures)  7. Elevated TSH  - TSH  8. Essential hypertension  - CBC with Differential/Platelet - COMPLETE METABOLIC PANEL WITH GFR - amLODipine (NORVASC) 5 MG tablet; Take 1 tablet (5 mg total) by mouth daily.  Dispense: 90 tablet; Refill: 1 - losartan (COZAAR) 100 MG tablet; Take 1 tablet (100 mg total) by mouth daily.  Dispense: 90 tablet; Refill: 1  9. Chronic hepatitis C without hepatic coma (HCC)  Refused therapy or evaluation by GI  10. Major depression, recurrent, chronic (HCC)  Stable on medication   11. Chronic pain syndrome  Keep follow up with pain clinic   12. Chronic insomnia  Taking Trazodone   13. Mild protein-calorie malnutrition (HCC)   14. Refused influenza vaccine   15. Refused diphtheria-tetanus vaccine   16. History of shoulder surgery   17. Need for vaccination for pneumococcus  - Pneumococcal conjugate vaccine 13-valent IM  18. Weight loss  - TSH

## 2020-11-08 ENCOUNTER — Ambulatory Visit (INDEPENDENT_AMBULATORY_CARE_PROVIDER_SITE_OTHER): Payer: Medicare Other | Admitting: Family Medicine

## 2020-11-08 ENCOUNTER — Other Ambulatory Visit: Payer: Self-pay

## 2020-11-08 ENCOUNTER — Encounter: Payer: Self-pay | Admitting: Family Medicine

## 2020-11-08 VITALS — BP 122/74 | HR 84 | Temp 99.0°F | Resp 16 | Ht 68.0 in | Wt 153.1 lb

## 2020-11-08 DIAGNOSIS — R634 Abnormal weight loss: Secondary | ICD-10-CM | POA: Diagnosis not present

## 2020-11-08 DIAGNOSIS — B182 Chronic viral hepatitis C: Secondary | ICD-10-CM | POA: Diagnosis not present

## 2020-11-08 DIAGNOSIS — E441 Mild protein-calorie malnutrition: Secondary | ICD-10-CM

## 2020-11-08 DIAGNOSIS — J411 Mucopurulent chronic bronchitis: Secondary | ICD-10-CM

## 2020-11-08 DIAGNOSIS — Z2821 Immunization not carried out because of patient refusal: Secondary | ICD-10-CM

## 2020-11-08 DIAGNOSIS — F5104 Psychophysiologic insomnia: Secondary | ICD-10-CM | POA: Diagnosis not present

## 2020-11-08 DIAGNOSIS — M810 Age-related osteoporosis without current pathological fracture: Secondary | ICD-10-CM

## 2020-11-08 DIAGNOSIS — K219 Gastro-esophageal reflux disease without esophagitis: Secondary | ICD-10-CM | POA: Diagnosis not present

## 2020-11-08 DIAGNOSIS — Z23 Encounter for immunization: Secondary | ICD-10-CM | POA: Diagnosis not present

## 2020-11-08 DIAGNOSIS — I10 Essential (primary) hypertension: Secondary | ICD-10-CM

## 2020-11-08 DIAGNOSIS — G894 Chronic pain syndrome: Secondary | ICD-10-CM | POA: Diagnosis not present

## 2020-11-08 DIAGNOSIS — Z9889 Other specified postprocedural states: Secondary | ICD-10-CM

## 2020-11-08 DIAGNOSIS — F339 Major depressive disorder, recurrent, unspecified: Secondary | ICD-10-CM | POA: Diagnosis not present

## 2020-11-08 DIAGNOSIS — E559 Vitamin D deficiency, unspecified: Secondary | ICD-10-CM

## 2020-11-08 DIAGNOSIS — R7989 Other specified abnormal findings of blood chemistry: Secondary | ICD-10-CM | POA: Diagnosis not present

## 2020-11-08 DIAGNOSIS — Z1211 Encounter for screening for malignant neoplasm of colon: Secondary | ICD-10-CM

## 2020-11-08 MED ORDER — AMLODIPINE BESYLATE 5 MG PO TABS: 5.0000 mg | ORAL_TABLET | Freq: Every day | ORAL | 1 refills | Status: AC

## 2020-11-08 MED ORDER — LOSARTAN POTASSIUM 100 MG PO TABS: 100.0000 mg | ORAL_TABLET | Freq: Every day | ORAL | 1 refills | Status: AC

## 2020-11-09 LAB — CBC WITH DIFFERENTIAL/PLATELET
Absolute Monocytes: 530 cells/uL (ref 200–950)
Basophils Absolute: 51 cells/uL (ref 0–200)
Basophils Relative: 1 %
Eosinophils Absolute: 112 cells/uL (ref 15–500)
Eosinophils Relative: 2.2 %
HCT: 40.3 % (ref 35.0–45.0)
Hemoglobin: 13.4 g/dL (ref 11.7–15.5)
Lymphs Abs: 1872 cells/uL (ref 850–3900)
MCH: 31 pg (ref 27.0–33.0)
MCHC: 33.3 g/dL (ref 32.0–36.0)
MCV: 93.3 fL (ref 80.0–100.0)
MPV: 11.2 fL (ref 7.5–12.5)
Monocytes Relative: 10.4 %
Neutro Abs: 2535 cells/uL (ref 1500–7800)
Neutrophils Relative %: 49.7 %
Platelets: 319 10*3/uL (ref 140–400)
RBC: 4.32 10*6/uL (ref 3.80–5.10)
RDW: 13.1 % (ref 11.0–15.0)
Total Lymphocyte: 36.7 %
WBC: 5.1 10*3/uL (ref 3.8–10.8)

## 2020-11-09 LAB — COMPLETE METABOLIC PANEL WITH GFR
AG Ratio: 1.4 (calc) (ref 1.0–2.5)
ALT: 15 U/L (ref 6–29)
AST: 27 U/L (ref 10–35)
Albumin: 4.3 g/dL (ref 3.6–5.1)
Alkaline phosphatase (APISO): 58 U/L (ref 37–153)
BUN/Creatinine Ratio: 12 (calc) (ref 6–22)
BUN: 11 mg/dL (ref 7–25)
CO2: 25 mmol/L (ref 20–32)
Calcium: 9.4 mg/dL (ref 8.6–10.4)
Chloride: 103 mmol/L (ref 98–110)
Creat: 0.95 mg/dL — ABNORMAL HIGH (ref 0.60–0.93)
GFR, Est African American: 68 mL/min/{1.73_m2} (ref >=60)
GFR, Est Non African American: 59 mL/min/{1.73_m2} — ABNORMAL LOW (ref >=60)
Globulin: 3.1 g/dL (calc) (ref 1.9–3.7)
Glucose, Bld: 88 mg/dL (ref 65–99)
Potassium: 5.1 mmol/L (ref 3.5–5.3)
Sodium: 138 mmol/L (ref 135–146)
Total Bilirubin: 0.8 mg/dL (ref 0.2–1.2)
Total Protein: 7.4 g/dL (ref 6.1–8.1)

## 2020-11-09 LAB — PARATHYROID HORMONE, INTACT (NO CA): PTH: 56 pg/mL (ref 14–64)

## 2020-11-09 LAB — TSH: TSH: 1.96 mIU/L (ref 0.40–4.50)

## 2020-11-09 LAB — VITAMIN D 25 HYDROXY (VIT D DEFICIENCY, FRACTURES): Vit D, 25-Hydroxy: 22 ng/mL — ABNORMAL LOW (ref 30–100)

## 2020-11-16 DIAGNOSIS — U071 COVID-19: Secondary | ICD-10-CM

## 2020-11-16 HISTORY — DX: COVID-19: U07.1

## 2020-11-21 ENCOUNTER — Emergency Department: Payer: Medicare Other

## 2020-11-21 ENCOUNTER — Other Ambulatory Visit: Payer: Self-pay

## 2020-11-21 ENCOUNTER — Encounter: Payer: Self-pay | Admitting: Radiology

## 2020-11-21 DIAGNOSIS — U071 COVID-19: Secondary | ICD-10-CM | POA: Insufficient documentation

## 2020-11-21 DIAGNOSIS — R509 Fever, unspecified: Secondary | ICD-10-CM | POA: Diagnosis present

## 2020-11-21 DIAGNOSIS — I10 Essential (primary) hypertension: Secondary | ICD-10-CM | POA: Diagnosis not present

## 2020-11-21 DIAGNOSIS — Z79899 Other long term (current) drug therapy: Secondary | ICD-10-CM | POA: Diagnosis not present

## 2020-11-21 DIAGNOSIS — F17210 Nicotine dependence, cigarettes, uncomplicated: Secondary | ICD-10-CM | POA: Diagnosis not present

## 2020-11-21 DIAGNOSIS — J449 Chronic obstructive pulmonary disease, unspecified: Secondary | ICD-10-CM | POA: Diagnosis not present

## 2020-11-21 DIAGNOSIS — R059 Cough, unspecified: Secondary | ICD-10-CM | POA: Diagnosis not present

## 2020-11-21 LAB — RESP PANEL BY RT-PCR (FLU A&B, COVID) ARPGX2
Influenza A by PCR: NEGATIVE
Influenza B by PCR: NEGATIVE
SARS Coronavirus 2 by RT PCR: POSITIVE — AB

## 2020-11-21 NOTE — ED Triage Notes (Signed)
Pt to ED reporting a COVID exposure. Pt has been having muscle aches, loss of taste, sore throat, headache and productive cough.

## 2020-11-22 ENCOUNTER — Encounter: Payer: Self-pay | Admitting: Nurse Practitioner

## 2020-11-22 ENCOUNTER — Telehealth: Payer: Self-pay | Admitting: Nurse Practitioner

## 2020-11-22 ENCOUNTER — Emergency Department
Admission: EM | Admit: 2020-11-22 | Discharge: 2020-11-22 | Disposition: A | Discharge status: Home / Self Care | Payer: Medicare Other | Attending: Emergency Medicine | Admitting: Emergency Medicine

## 2020-11-22 DIAGNOSIS — U071 COVID-19: Secondary | ICD-10-CM

## 2020-11-22 NOTE — ED Provider Notes (Signed)
[facility] Emergency Department Provider Note  ____________________________________________   First MD Initiated Contact with Patient 11/22/20 [phone]     (approximate)  I have reviewed the triage vital signs and the nursing notes.   HISTORY  Chief Complaint COVID exposure    HPI Natalie Lloyd is a 74 y.o. female with medical history as listed below who presents for evaluation of about 6 days of COVID-like symptoms including fever, malaise, fatigue, decreased appetite, shortness of breath with exertion, loss of smell and taste, and mild sore throat.  She does not know for sure who she was around that had COVID, but her son-in-law has been hospitalized within the last 24 hours and her daughter was seen earlier tonight (by me) for COVID-19 as well.  The patient has received 1 vaccination for COVID-19 which she received about 6 months ago but she did not receive any additional vaccinations.  Patient describes her symptoms as moderate but not getting any worse.  She is able to drink fluids and eat even though she has not had as much of an appetite as usual.  She denies chest pain and abdominal pain.  Her shortness of breath gets a little bit worse with exertion but in general it is not bothering her too much.  The reason that she came to the ED was because of the positive family members including the son-in-law who was hospitalized.         Past Medical History:  Diagnosis Date  . Anxiety   . Arthritis   . COPD (chronic obstructive pulmonary disease) (HCC)   . DDD (degenerative disc disease), lumbar   . Depression   . Generalized OA   . GERD (gastroesophageal reflux disease)   . Hypertension   . Myofascial pain syndrome 08/02/2020  . Osteoporosis   . Sleep apnea   . Substance abuse [facility])     Patient Active Problem List   Diagnosis Date Noted  . Dysfunction of left rotator cuff 08/02/2020  . Primary osteoarthritis of left shoulder 08/02/2020  . History  of shoulder surgery 08/02/2020  . Myofascial pain syndrome 08/02/2020  . Chronic hepatitis C without hepatic coma (HCC) 10/27/2019  . Long term current use of opiate analgesic 01/27/2019  . Chronic upper extremity pain (R>L) 01/27/2019  . Elevated TSH 01/27/2019  . Elevated PTHrP level 01/27/2019  . Vitamin D deficiency 01/27/2019  . H/O cervical spine surgery 12/02/2018  . Disorder of tendon of right biceps 12/02/2018  . Pain in joint of right shoulder 12/02/2018  . 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 Chronic left shoulder pain 12/02/2018  . Cervical spondylosis 12/02/2018  . COPD (chronic obstructive pulmonary disease) (HCC) 10/22/2018  . Hypertension 10/22/2018  . Sleep apnea 10/22/2018  . GERD (gastroesophageal reflux disease) 10/22/2018  . Osteoporosis 10/22/2018  . History of pneumonia 10/22/2018  . Major depression, recurrent, chronic (HCC) 10/22/2018  . Chronic insomnia 10/22/2018  . DDD (degenerative disc disease), lumbar 10/22/2018    Past Surgical History:  Procedure Laterality Date  . APPENDECTOMY    . CARPAL TUNNEL RELEASE    . CERVICAL SPINE SURGERY    . ROTATOR CUFF REPAIR      Prior to Admission medications   Medication Sig Start Date End Date Taking? Authorizing Provider  albuterol (VENTOLIN HFA) 108 (90 Base) MCG/ACT inhaler INHALE 2 PUFFS INTO THE LUNGS EVERY 6 HOURS AS NEEDED FOR WHEEZING OR SHORTNESS OF BREATH 10/27/19   [name], Danna Hefty, MD  amLODipine (NORVASC) 5 MG  tablet Take 1 tablet (5 mg total) by mouth daily. 11/08/20   Alba CorySowles, Krichna, MD  calcium-vitamin D (OSCAL WITH D) 500-200 MG-UNIT tablet Take 1 tablet by mouth.    [provider]  Cholecalciferol (VITAMIN D) 50 MCG (2000 UT) CAPS Take 1 capsule (2,000 Units total) by mouth daily. 04/24/19   Alba CorySowles, Krichna, MD  HYDROcodone-acetaminophen (NORCO) 10-325 MG tablet Take 1 tablet by mouth every 8 (eight) hours as needed for severe pain. Patient not taking: Reported on 11/08/2020  11/09/20 12/09/20  Edward JollyLateef, Bilal, MD  HYDROcodone-acetaminophen (NORCO) 10-325 MG tablet Take 1 tablet by mouth every 8 (eight) hours as needed for severe pain. Patient not taking: Reported on 11/08/2020 12/09/20 01/08/21  Edward JollyLateef, Bilal, MD  HYDROcodone-acetaminophen (NORCO) 10-325 MG tablet Take 1 tablet by mouth every 8 (eight) hours as needed for severe pain. 01/08/21 02/07/21  Edward JollyLateef, Bilal, MD  loratadine (CLARITIN) 10 MG tablet Take 1 tablet (10 mg total) by mouth daily. 10/18/20   Alba CorySowles, Krichna, MD  losartan (COZAAR) 100 MG tablet Take 1 tablet (100 mg total) by mouth daily. 11/08/20   Alba CorySowles, Krichna, MD  pantoprazole (PROTONIX) 40 MG tablet TAKE 1 TABLET BY MOUTH EVERY DAY 09/22/20   Carlynn PurlSowles, Danna HeftyKrichna, MD  PARoxetine (PAXIL) 40 MG tablet TAKE 1 TABLET BY MOUTH EVERY DAY IN THE MORNING 08/17/20   Alba CorySowles, Krichna, MD  traZODone (DESYREL) 50 MG tablet TAKE 1 TABLET (50 MG TOTAL) BY MOUTH AT BEDTIME AS NEEDED FOR SLEEP. 10/17/20   Alba CorySowles, Krichna, MD    Allergies Patient has no known allergies.  No family history on file.  Social History Social History   Tobacco Use  . Smoking status: Current Some Day Smoker    Packs/day: 0.25    Years: 50.00    Pack years: 12.50    Types: Cigarettes  . Smokeless tobacco: Never Used  . Tobacco comment: 3-4 cigarettes per day  Vaping Use  . Vaping Use: Never used  Substance Use Topics  . Alcohol use: Yes    Comment: occ wine cooler  . Drug use: Not Currently    Types: IV    Comment: 37.5 yrs ago    Review of Systems Constitutional: +fever/chills Eyes: No visual changes. ENT: +sore throat. Cardiovascular: Denies chest pain. Respiratory: +shortness of breath. Gastrointestinal: No abdominal pain.  No nausea, no vomiting.  No diarrhea.  No constipation. Genitourinary: Negative for dysuria. Musculoskeletal: Negative for neck pain.  Negative for back pain. Integumentary: Negative for rash. Neurological: Negative for headaches, focal weakness or  numbness.   ____________________________________________   PHYSICAL EXAM:  VITAL SIGNS: ED Triage Vitals  Enc Vitals Group     BP 11/21/20 1843 139/79     Pulse Rate 11/21/20 1843 87     Resp 11/21/20 1843 20     Temp 11/21/20 1843 98.1 F (36.7 C)     Temp Source 11/21/20 1843 Oral     SpO2 11/21/20 1843 96 %     Weight 11/21/20 1844 70.8 kg (156 lb)     Height 11/21/20 1844 1.727 m (5\' 8" )     Head Circumference --      Peak Flow --      Pain Score 11/21/20 1859 0     Pain Loc --      Pain Edu? --      Excl. in GC? --     Constitutional: Alert and oriented.  Eyes: Conjunctivae are normal.  Head: Atraumatic. Nose: No congestion/rhinnorhea. Mouth/Throat: Patient is wearing  a mask. Neck: No stridor.  No meningeal signs.   Cardiovascular: Normal rate, regular rhythm. Good peripheral circulation. Grossly normal heart sounds. Respiratory: Normal respiratory effort.  No retractions. Gastrointestinal: Soft and nontender. No distention.  Musculoskeletal: No lower extremity tenderness nor edema. No gross deformities of extremities. Neurologic:  Normal speech and language. No gross focal neurologic deficits are appreciated.  Skin:  Skin is warm, dry and intact. Psychiatric: Mood and affect are normal. Speech and behavior are normal.  ____________________________________________   LABS (all labs ordered are listed, but only abnormal results are displayed)  Labs Reviewed  RESP PANEL BY RT-PCR (FLU A&B, COVID) ARPGX2 - Abnormal; Notable for the following components:      Result Value   SARS Coronavirus 2 by RT PCR POSITIVE (*)    All other components within normal limits   ____________________________________________  EKG  None - EKG not ordered by ED physician ____________________________________________  RADIOLOGY Marylou Mccoy, personally viewed and evaluated these images (plain radiographs) as part of my medical decision making, as well as reviewing the written  report by the radiologist.  ED MD interpretation: No acute abnormalities identified on chest x-ray  Official radiology report(s): DG Chest 2 View  Result Date: 11/21/2020 CLINICAL DATA:  Cough EXAM: CHEST - 2 VIEW COMPARISON:  None. FINDINGS: The heart size and mediastinal contours are within normal limits. Both lungs are clear. A right shoulder arthroplasty is seen. IMPRESSION: No active cardiopulmonary disease. Electronically Signed   By: Jonna Clark M.D.   On: 11/21/2020 21:41    ____________________________________________   PROCEDURES   Procedure(s) performed (including Critical Care):  Procedures   ____________________________________________   INITIAL IMPRESSION / MDM / ASSESSMENT AND PLAN / ED COURSE  As part of my medical decision making, I reviewed the following data within the electronic MEDICAL RECORD NUMBER Nursing notes reviewed and incorporated, Labs reviewed , Old chart reviewed and Notes from prior ED visits   Covid positive PCR test, with minimal symptoms and overall well-appearing.  Stable vital signs.  No respiratory distress, mild dyspnea on exertion, no significant cough.  I personally reviewed the patient's imaging and agree with the radiologist's interpretation that there is no sign of any acute abnormality.  I had my usual and customary outpatient COVID-19 management discussion with the patient and she understands and agrees with the plan.  There is no indication for any medications at this time.  She will follow-up by phone or virtual visit with her PCP.  I gave my usual return precautions.          ____________________________________________  FINAL CLINICAL IMPRESSION(S) / ED DIAGNOSES  Final diagnoses:  COVID-19     MEDICATIONS GIVEN DURING THIS VISIT:  Medications - No data to display   ED Discharge Orders    None      *Please note:  Berline Semrad was evaluated in Emergency Department on 11/22/2020 for the symptoms described in the  history of present illness. She was evaluated in the context of the global COVID-19 pandemic, which necessitated consideration that the patient might be at risk for infection with the SARS-CoV-2 virus that causes COVID-19. Institutional protocols and algorithms that pertain to the evaluation of patients at risk for COVID-19 are in a state of rapid change based on information released by regulatory bodies including the CDC and federal and state organizations. These policies and algorithms were followed during the patient's care in the ED.  Some ED evaluations and interventions may be delayed as a result  of limited staffing during and after the pandemic.*  Note:  This document was prepared using Dragon voice recognition software and may include unintentional dictation errors.   Loleta Rose, MD 11/22/20 [phone]

## 2020-11-22 NOTE — Telephone Encounter (Signed)
Called patient to discuss Covid symptoms and the use of casirivimab/imdevimab, a monoclonal antibody infusion for those with mild to moderate Covid symptoms and at a high risk of hospitalization.  Pt is qualified for this infusion at the [facility] infusion center due to; Specific high risk criteria : Older age (>/= 75 yo), Cardiovascular disease or hypertension and Chronic Lung Disease   Message left to call back our hotline [phone].  Nicolasa Ducking, NP

## 2020-11-22 NOTE — Discharge Instructions (Addendum)
As we discussed, although you have tested positive for COVID-19 (coronavirus), you do not need to be hospitalized at this time.  Read through all the included information including the recommendations from the CDC.  We recommend that you self-quarantine at home with your immediate family only (people with whom you have already been in contact) for 10-14 days after your fever has gone away (without taking medication to make your temperature come down, such as Tylenol (acetaminophen)), after your respiratory symptoms have improved, and after at least 14 days have passed since your symptoms first appeared.  You should have as minimal contact as possible with anyone else including close family as per the CDC paperwork guidelines listed below. Follow-up with your doctor by phone or online as needed and return immediately to the emergency department or call 911 only if you develop new or worsening symptoms that concern you.  If you were prescribed any medications, please use them as instructed.  If you were given information for the COVID-19 antibody infusion treatment clinic, please call them and leave your contact information.  This can be a very effective and important treatment method, and you should discuss with them if you qualify for treatment.  They may even have transportation services available to them from [place] if you require them.  You can find up-to-date information about COVID-19 in [place] by calling the [place] Coronavirus Helpline: 1-866-462-3821. You may also call 2-1-1, or 888-892-1162, or additional resources.  You can also find information online at https://www.ncdhhs.gov/divisions/public-health/coronavirus-disease-2019-covid-19-response-[place], or on the Center for Disease Control (CDC) website at https://www.cdc.gov/coronavirus/2019-ncov/index.html.  

## 2020-11-29 ENCOUNTER — Telehealth (INDEPENDENT_AMBULATORY_CARE_PROVIDER_SITE_OTHER): Payer: Medicare Other | Admitting: Internal Medicine

## 2020-11-29 DIAGNOSIS — U071 COVID-19: Secondary | ICD-10-CM

## 2020-11-29 DIAGNOSIS — J449 Chronic obstructive pulmonary disease, unspecified: Secondary | ICD-10-CM

## 2020-11-29 NOTE — Progress Notes (Signed)
Name: Natalie Lloyd   MRN: [id number]    DOB: 1946-08-19   Date:11/29/2020       Progress Note  Subjective  Chief Complaint  Chief Complaint  Patient presents with  . Covid Positive    I connected with  [name] on 11/29/20 at  9:00 AM EST by telephone and verified that I am speaking with the correct person using two identifiers.  I discussed the limitations, risks, security and privacy concerns of performing an evaluation and management service by telephone and the availability of in person appointments. The patient expressed understanding and agreed to proceed. Staff also discussed with the patient that there may be a patient responsible charge related to this service. Patient Location: Home Provider Location: [facility] Additional Individuals present: none  HPI Patient is a 74 year old female patient of Dr. [name] Last visit with her was 11/08/2020. She was seen in the emergency room 11/22/2020 with 6 days of Covid-like symptoms She noted the reason she went to the emergency room was because of family members that had been positive including a son-in-law who was hospitalized She had a Covid positive PCR test with minimal symptoms and overall well-appearing noted.  A chest x-ray was unremarkable.  There was no indication for any medications at that time noted. Follow-up today with a phone visit.  Medication regimen reviewed  Notes some symptoms that persist include: + coughing bouts, intermittently, minimal production, but usually dry, and is better than a few days ago No marked SOB at rest, does get SOB at times when up and about in the house No fever, has subsided (had first couple days), no chills + mild sore throat and congestion and better recent past + loss of smell, loss of taste, but taste is getting better as is the smell (everything tasted the same and appetite down resulting from that)  N/V subsided, was prior, not threw up in 5 days now + muscle aches No marked  loose stools/had a bout of diarrhea, not have one in a couple days, was loose No CP,  passing out episodes, was dizzy at times getting up from bed and better  Taking albuterol prn, up to twice a day Comorbid conditions reviewed  + COPD -not on maintenance medication, has albuterol to use as needed + HTN hx,  + chronic hepatitis C  Tobacco-current some day smoker, quit since 25th of November  Daughter and her diagnosed at the same time, has f/u at [facility] tomorrow to get tested again since has been 10 days, not sure needed and discussed. [name] has it.  Staying isolated presently.  Patient Active Problem List   Diagnosis Date Noted  . COVID-19 virus infection 11/2020  . Dysfunction of left rotator cuff 08/02/2020  . Primary osteoarthritis of left shoulder 08/02/2020  . History of shoulder surgery 08/02/2020  . Myofascial pain syndrome 08/02/2020  . Chronic hepatitis C without hepatic coma (HCC) 10/27/2019  . Long term current use of opiate analgesic 01/27/2019  . Chronic upper extremity pain (R>L) 01/27/2019  . Elevated TSH 01/27/2019  . Elevated PTHrP level 01/27/2019  . Vitamin D deficiency 01/27/2019  . H/O cervical spine surgery 12/02/2018  . Disorder of tendon of right biceps 12/02/2018  . Pain in joint of right shoulder 12/02/2018  . 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 Chronic left shoulder pain 12/02/2018  . Cervical spondylosis 12/02/2018  . COPD (chronic obstructive pulmonary disease) (HCC) 10/22/2018  . Hypertension 10/22/2018  . Sleep apnea 10/22/2018  . GERD (  gastroesophageal reflux disease) 10/22/2018  . Osteoporosis 10/22/2018  . History of pneumonia 10/22/2018  . Major depression, recurrent, chronic (HCC) 10/22/2018  . Chronic insomnia 10/22/2018  . DDD (degenerative disc disease), lumbar 10/22/2018    Past Surgical History:  Procedure Laterality Date  . APPENDECTOMY    . CARPAL TUNNEL RELEASE    . CERVICAL SPINE SURGERY    . ROTATOR CUFF REPAIR       No family history on file.  Social History   Tobacco Use  . Smoking status: Current Some Day Smoker    Packs/day: 0.25    Years: 50.00    Pack years: 12.50    Types: Cigarettes  . Smokeless tobacco: Never Used  . Tobacco comment: 3-4 cigarettes per day  Substance Use Topics  . Alcohol use: Yes    Comment: occ wine cooler     Current Outpatient Medications:  .  albuterol (VENTOLIN HFA) 108 (90 Base) MCG/ACT inhaler, INHALE 2 PUFFS INTO THE LUNGS EVERY 6 HOURS AS NEEDED FOR WHEEZING OR SHORTNESS OF BREATH, Disp: 18 g, Rfl: 2 .  amLODipine (NORVASC) 5 MG tablet, Take 1 tablet (5 mg total) by mouth daily., Disp: 90 tablet, Rfl: 1 .  calcium-vitamin D (OSCAL WITH D) 500-200 MG-UNIT tablet, Take 1 tablet by mouth., Disp: , Rfl:  .  Cholecalciferol (VITAMIN D) 50 MCG (2000 UT) CAPS, Take 1 capsule (2,000 Units total) by mouth daily., Disp: 100 capsule, Rfl: 1 .  HYDROcodone-acetaminophen (NORCO) 10-325 MG tablet, Take 1 tablet by mouth every 8 (eight) hours as needed for severe pain. (Patient not taking: Reported on 11/08/2020), Disp: 90 tablet, Rfl: 0 .  [START ON 12/09/2020] HYDROcodone-acetaminophen (NORCO) 10-325 MG tablet, Take 1 tablet by mouth every 8 (eight) hours as needed for severe pain. (Patient not taking: Reported on 11/08/2020), Disp: 90 tablet, Rfl: 0 .  [START ON 01/08/2021] HYDROcodone-acetaminophen (NORCO) 10-325 MG tablet, Take 1 tablet by mouth every 8 (eight) hours as needed for severe pain., Disp: 90 tablet, Rfl: 0 .  loratadine (CLARITIN) 10 MG tablet, Take 1 tablet (10 mg total) by mouth daily., Disp: 90 tablet, Rfl: 1 .  losartan (COZAAR) 100 MG tablet, Take 1 tablet (100 mg total) by mouth daily., Disp: 90 tablet, Rfl: 1 .  pantoprazole (PROTONIX) 40 MG tablet, TAKE 1 TABLET BY MOUTH EVERY DAY, Disp: 90 tablet, Rfl: 0 .  PARoxetine (PAXIL) 40 MG tablet, TAKE 1 TABLET BY MOUTH EVERY DAY IN THE MORNING, Disp: 90 tablet, Rfl: 1 .  traZODone (DESYREL) 50 MG tablet,  TAKE 1 TABLET (50 MG TOTAL) BY MOUTH AT BEDTIME AS NEEDED FOR SLEEP., Disp: 90 tablet, Rfl: 0  No Known Allergies  With staff assistance, above reviewed with the patient today.  ROS: As per HPI, otherwise no specific complaints on a limited and focused system review   Objective  Virtual encounter, vitals not obtained.  There is no height or weight on file to calculate BMI.  Physical Exam   Appears in NAD via conversation, no coughing episodes during our phone conversation, pleasant Breathing: No obvious respiratory distress. Speaking in complete sentences Neurological: Pt is alert, Speech is normal Psychiatric: Patient has a normal mood and affect, behavior is normal. Judgment and thought content normal.   No results found for this or any previous visit (from the past 72 hour(s)).  PHQ2/9: Depression screen [facility] 2/9 11/29/2020 11/08/2020 11/01/2020 10/27/2020 10/18/2020  Decreased Interest 0 1 0 0 0  Down, Depressed, Hopeless 1 0 0 0  0  PHQ - 2 Score 1 1 0 0 0  Altered sleeping 1 1 - - 0  Tired, decreased energy 1 1 - - 0  Change in appetite 1 0 - - 0  Feeling bad or failure about yourself  0 0 - - 0  Trouble concentrating 0 0 - - 0  Moving slowly or fidgety/restless 0 0 - - 0  Suicidal thoughts 0 0 - - 0  PHQ-9 Score 4 3 - - 0  Difficult doing work/chores Not difficult at all Not difficult at all - - -  Some recent data might be hidden   PHQ-2/9 Result reviewed  Fall Risk: Fall Risk  11/29/2020 11/08/2020 11/01/2020 10/27/2020 10/18/2020  Falls in the past year? 0 1 0 0 1  Number falls in past yr: 0 0 - 0 0  Injury with Fall? 0 0 - 0 0  Risk for fall due to : - History of fall(s) - - History of fall(s);Impaired balance/gait  Follow up - - - - -     Assessment & Plan 1. COVID  Educated on Covid and management recommendations, monoclonal antibody treatments entertained for non-hospitalized patients at risk for severe disease within 10 days of symptom onset, and she  is past that time presently. All in all, her symptoms are improved over the last few days, although she still notes she is not close to 100% yet. Recommend symptomatic measures - stay well hydrated, rest, a mucinex OTC product as and tylenol prn for any low grade temps/fevers/aches.  She should also continue her albuterol inhaler 2-3 times a day routinely, as will help with clearance, and as symptoms improve/resolve, will return to just as needed use. Remain isolated presently until recovered and symptoms significantly improved/resolved and she states she plans to do so. Discussed no real benefit to getting tested again tomorrow, and the importance of just continuing to closely monitor symptoms.  If sx's worsening acutely, such as increased SOB, fevers, CP, weakness or other concerning sx's arise, should f/u and be seen more emergently in the ER for more immediate evaluation.  2. Chronic obstructive pulmonary disease, unspecified COPD type (HCC) She was not on any maintenance medications, just albuterol as needed for management prior to Covid diagnosis. She notes her breathing symptoms and coughing episodes have improved recently. As above, we will continue the albuterol inhaler as recommended.  We will continue to hold off on adding steroids presently, noting she is improving, and noting the risk/benefits of these medicines. She has tried to remain free of tobacco, and strongly encouraged her to continue with that and the importance of complete tobacco cessation.  I discussed the assessment and treatment plan with the patient. The patient was provided an opportunity to ask questions and all were answered. The patient agreed with the plan and demonstrated an understanding of the instructions.  Red flags and when to present for emergency care or RTC including fevers,  chest pain, shortness of breath, new/worsening/un-resolving symptoms reviewed with patient at time of visit.   The patient was  advised to call back or seek an in-person evaluation if the symptoms worsen or if the condition fails to improve as anticipated.  I provided 20 minutes of non-face-to-face time during this encounter that included discussing at length patient's sx/history, pertinent pmhx, medications, treatment and follow up plan. This time also included the necessary documentation, orders, and chart review.  Jamelle Haring, MD

## 2020-11-30 DIAGNOSIS — Z20822 Contact with and (suspected) exposure to covid-19: Secondary | ICD-10-CM | POA: Diagnosis not present

## 2021-01-09 ENCOUNTER — Telehealth: Payer: Self-pay | Admitting: Family Medicine

## 2021-01-09 DIAGNOSIS — F5104 Psychophysiologic insomnia: Secondary | ICD-10-CM

## 2021-01-10 NOTE — Telephone Encounter (Signed)
Pt scheduled and informed of refill

## 2021-01-18 ENCOUNTER — Telehealth: Payer: Self-pay

## 2021-01-18 NOTE — Telephone Encounter (Signed)
Copied from CRM [id number]. Topic: General - Other >> Jan 18, 2021  2:50 PM Lyn Hollingshead D wrote: PT wants let Dr [name] know she's moving to [place] / please advise

## 2021-01-18 NOTE — Telephone Encounter (Unsigned)
Copied from CRM #[id number]. Topic: General - Other >> Jan 18, 2021  2:50 PM Soria, Christian D wrote: PT wants let Dr Sowles know she's moving to [place] / please advise 

## 2021-01-26 ENCOUNTER — Encounter: Payer: Medicare Other | Admitting: Student in an Organized Health Care Education/Training Program

## 2021-05-10 ENCOUNTER — Ambulatory Visit: Payer: Medicare Other | Admitting: Family Medicine

## 2021-07-28 ENCOUNTER — Ambulatory Visit: Payer: Medicare Other

## 2021-08-01 ENCOUNTER — Telehealth: Payer: Self-pay | Admitting: Family Medicine

## 2021-08-01 NOTE — Telephone Encounter (Signed)
Copied from CRM [id number]. Topic: Medicare AWV >> Aug 01, 2021  1:54 PM Claudette Laws R wrote: Reason for CRM:  No answer unable to leave a  message for patient to call back and schedule Medicare Annual Wellness Visit (AWV) in office.   If unable to come into the office for AWV,  please offer to do virtually or by telephone.  Last AWV: 08/11/2020  Please schedule at anytime with [facility] Health Advisor.  40 minute appointment  Any questions, please contact me at [phone]

## 2021-08-16 ENCOUNTER — Telehealth: Payer: Self-pay | Admitting: Family Medicine

## 2021-08-16 NOTE — Telephone Encounter (Signed)
Copied from CRM [id number]. Topic: Medicare AWV >> Aug 16, 2021 11:09 AM Claudette Laws R wrote: Reason for CRM:  No answer unable to leave a message for patient to call back and schedule Medicare Annual Wellness Visit (AWV) in office.   If unable to come into the office for AWV,  please offer to do virtually or by telephone.  Last AWV: 08/12/2021  Please schedule at anytime with [facility] Health Advisor.  40 minute appointment  Any questions, please contact me at [phone]

## 2021-10-15 IMAGING — CR DG CHEST 2V
2 series · 2 of 2 positions shown · non-contrast
Comparison: None.

CLINICAL DATA: Cough

EXAM:
CHEST - 2 VIEW

[chest pa]
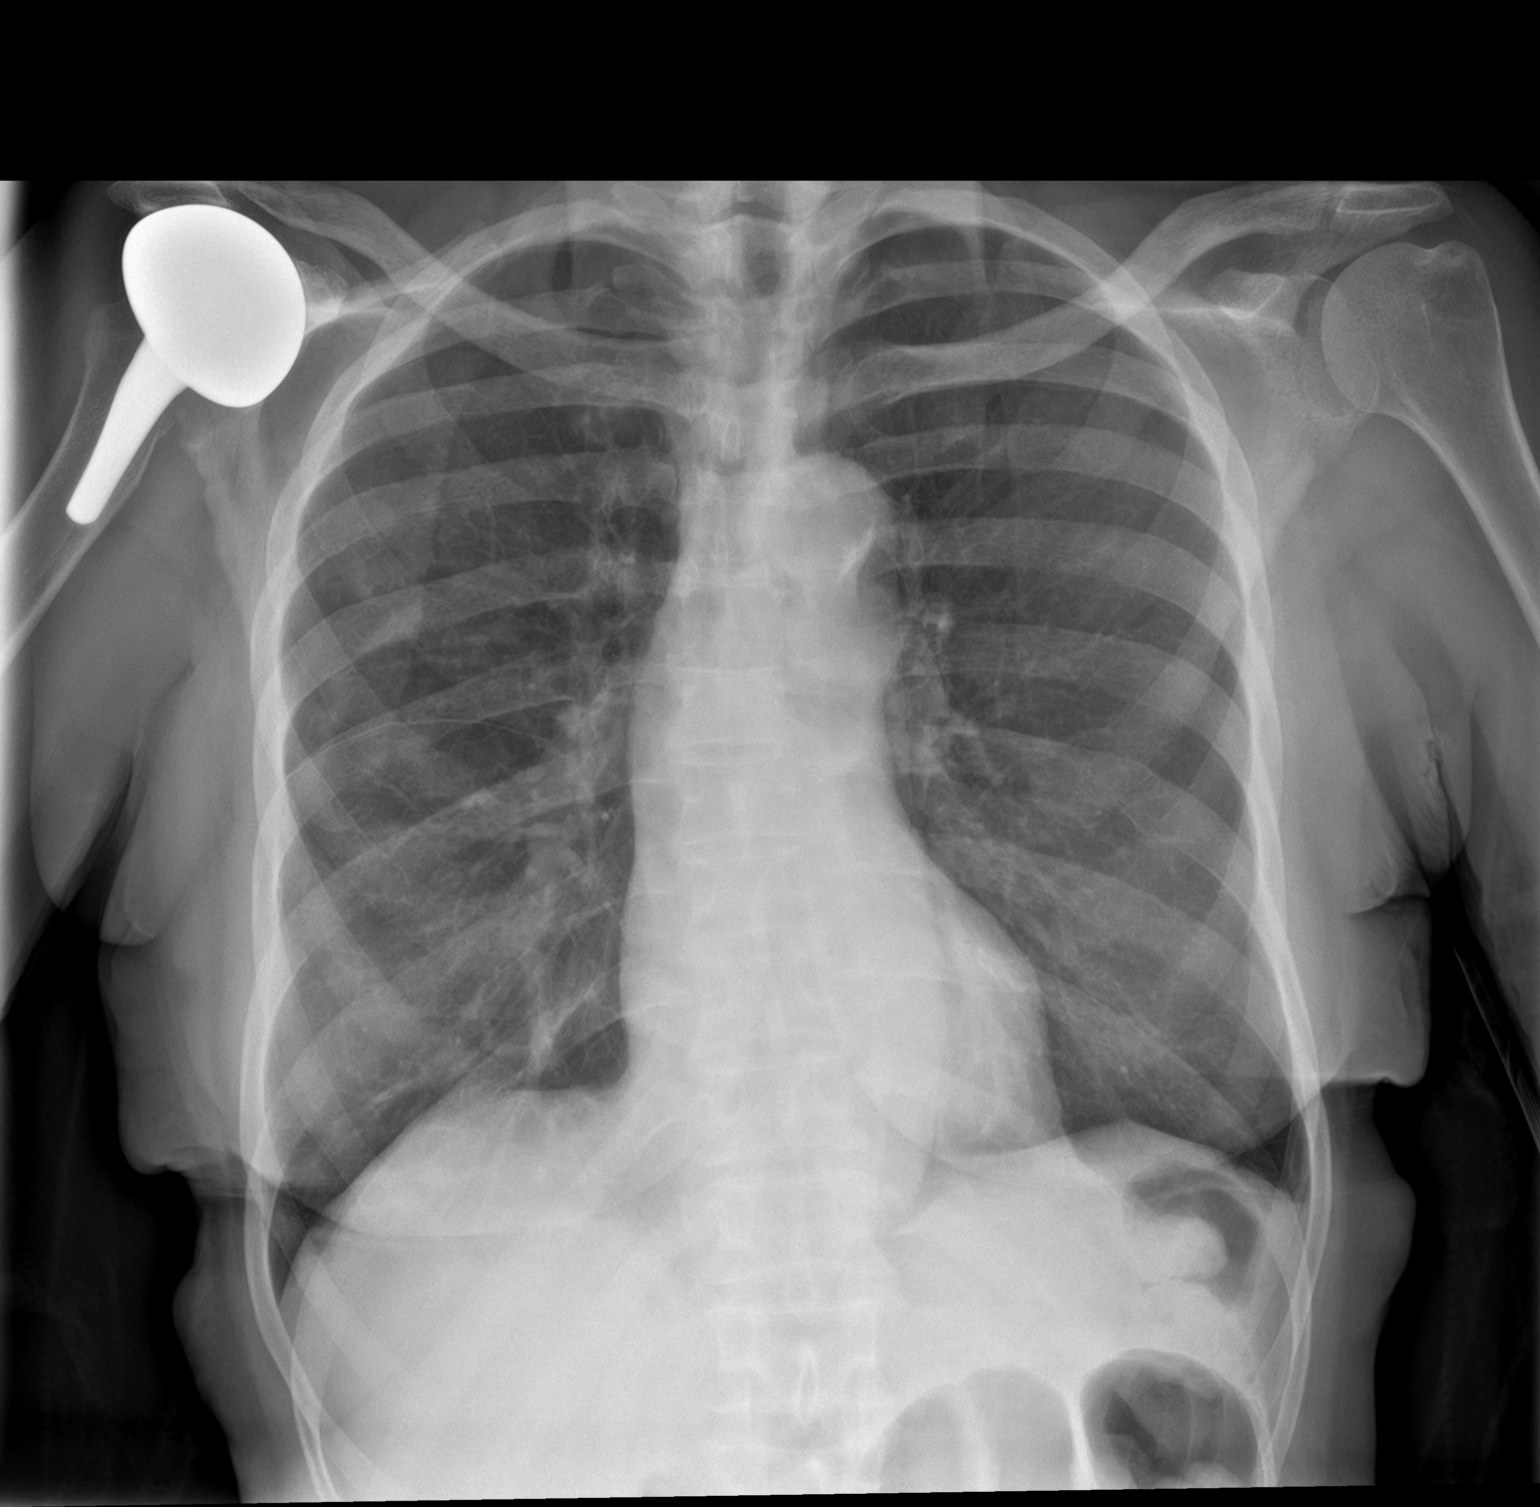

[chest lat]
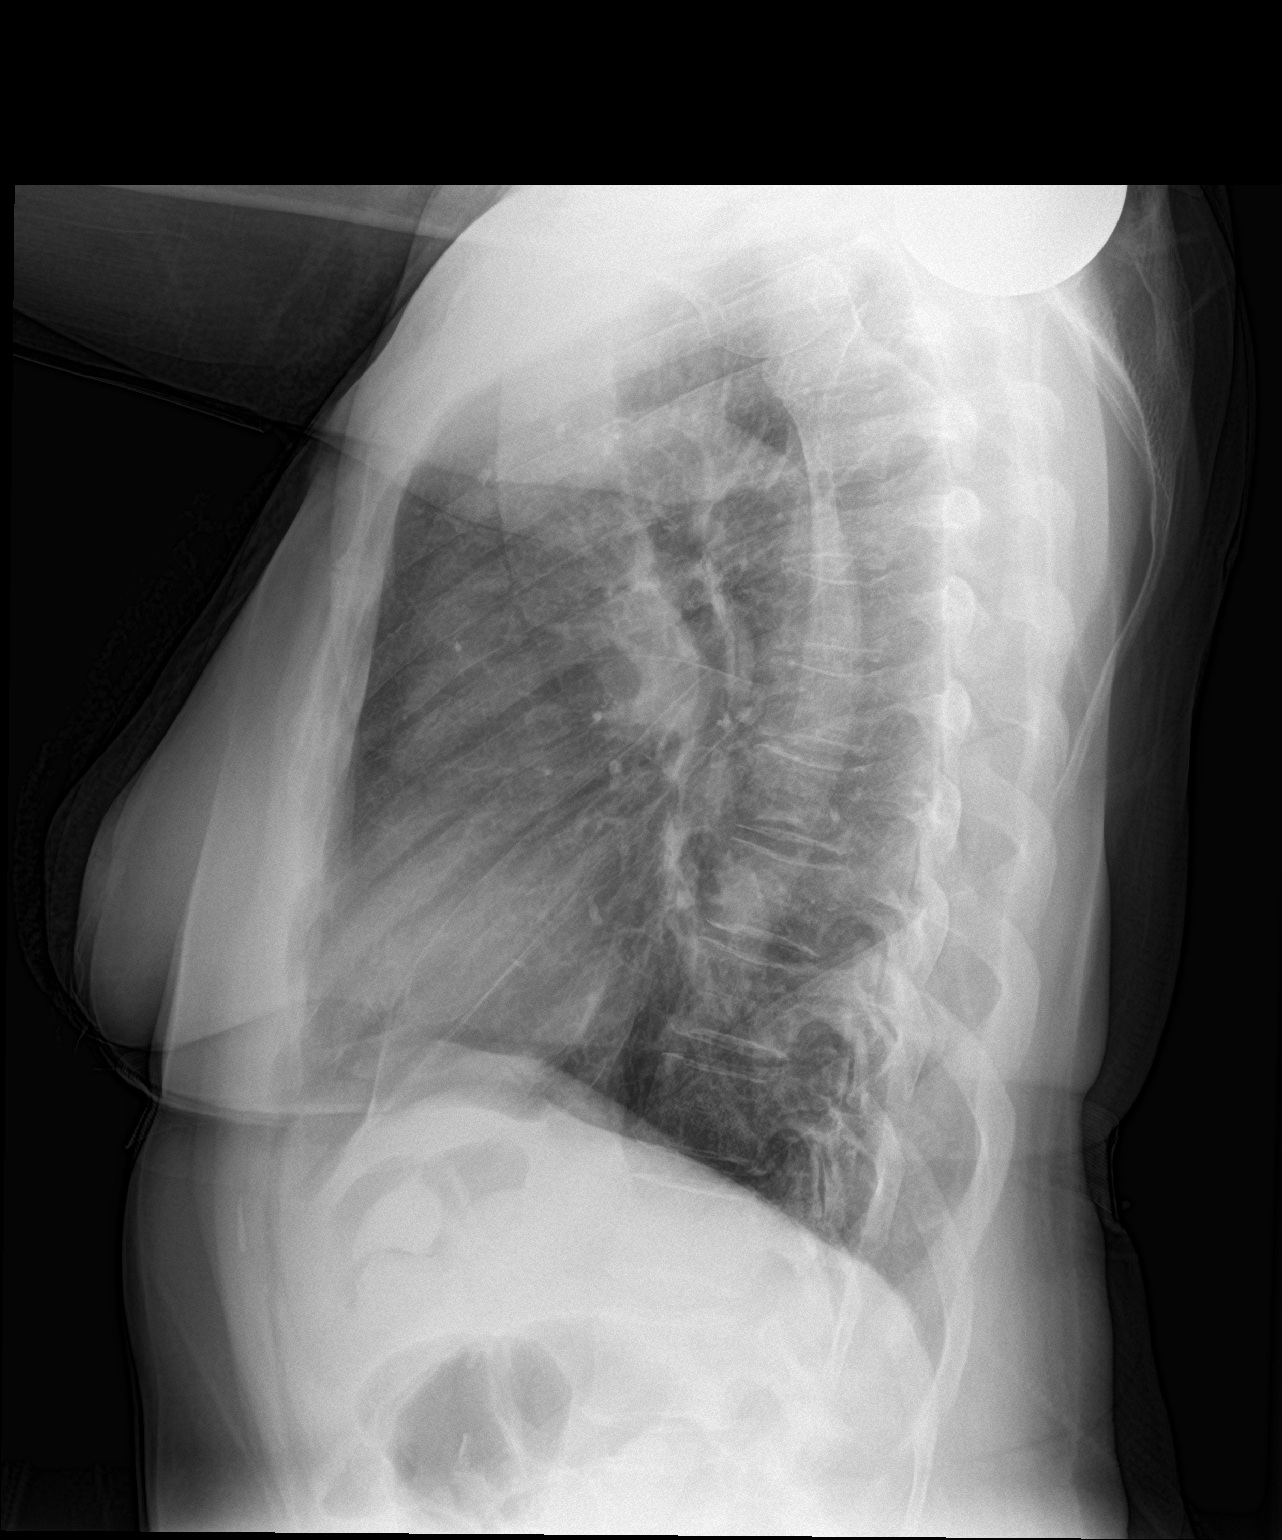

[2 of 2 positions shown; findings below may reference images not displayed]

FINDINGS: The heart size and mediastinal contours are within normal limits.
Both lungs are clear. A right shoulder arthroplasty is seen.
IMPRESSION: No active cardiopulmonary disease.
